# Patient Record
Sex: Female | Born: 1950
Health system: Southern US, Community
[De-identification: ages and names within clinical notes are randomized; demographics above are authoritative.]

## PROBLEM LIST (undated history)

## (undated) DIAGNOSIS — S82891A Other fracture of right lower leg, initial encounter for closed fracture: Secondary | ICD-10-CM

## (undated) DIAGNOSIS — M1990 Unspecified osteoarthritis, unspecified site: Secondary | ICD-10-CM

## (undated) DIAGNOSIS — R519 Headache, unspecified: Secondary | ICD-10-CM

## (undated) DIAGNOSIS — F329 Major depressive disorder, single episode, unspecified: Secondary | ICD-10-CM

## (undated) DIAGNOSIS — I2510 Atherosclerotic heart disease of native coronary artery without angina pectoris: Secondary | ICD-10-CM

## (undated) DIAGNOSIS — K219 Gastro-esophageal reflux disease without esophagitis: Secondary | ICD-10-CM

## (undated) DIAGNOSIS — I10 Essential (primary) hypertension: Secondary | ICD-10-CM

## (undated) DIAGNOSIS — I351 Nonrheumatic aortic (valve) insufficiency: Secondary | ICD-10-CM

## (undated) DIAGNOSIS — G8929 Other chronic pain: Secondary | ICD-10-CM

## (undated) DIAGNOSIS — M549 Dorsalgia, unspecified: Secondary | ICD-10-CM

## (undated) DIAGNOSIS — F32A Depression, unspecified: Secondary | ICD-10-CM

## (undated) DIAGNOSIS — K469 Unspecified abdominal hernia without obstruction or gangrene: Secondary | ICD-10-CM

## (undated) DIAGNOSIS — F419 Anxiety disorder, unspecified: Secondary | ICD-10-CM

## (undated) DIAGNOSIS — D649 Anemia, unspecified: Secondary | ICD-10-CM

## (undated) HISTORY — DX: Major depressive disorder, single episode, unspecified: F32.9

## (undated) HISTORY — DX: Anxiety disorder, unspecified: F41.9

## (undated) HISTORY — PX: COLONOSCOPY: SHX174

## (undated) HISTORY — DX: Depression, unspecified: F32.A

## (undated) HISTORY — DX: Dorsalgia, unspecified: M54.9

## (undated) HISTORY — PX: CATARACT EXTRACTION W/ INTRAOCULAR LENS IMPLANT: SHX1309

## (undated) HISTORY — PX: WISDOM TOOTH EXTRACTION: SHX21

## (undated) HISTORY — DX: Atherosclerotic heart disease of native coronary artery without angina pectoris: I25.10

## (undated) HISTORY — DX: Other chronic pain: G89.29

## (undated) HISTORY — DX: Nonrheumatic aortic (valve) insufficiency: I35.1

---

## 1997-10-26 ENCOUNTER — Other Ambulatory Visit: Admission: RE | Admit: 1997-10-26 | Discharge: 1997-10-26 | Payer: Self-pay | Admitting: Obstetrics & Gynecology

## 1998-11-09 ENCOUNTER — Other Ambulatory Visit: Admission: RE | Admit: 1998-11-09 | Discharge: 1998-11-09 | Payer: Self-pay | Admitting: Obstetrics & Gynecology

## 1999-12-18 ENCOUNTER — Other Ambulatory Visit: Admission: RE | Admit: 1999-12-18 | Discharge: 1999-12-18 | Payer: Self-pay | Admitting: Obstetrics & Gynecology

## 2000-06-23 ENCOUNTER — Emergency Department (HOSPITAL_COMMUNITY): Admission: EM | Admit: 2000-06-23 | Discharge: 2000-06-23 | Payer: Self-pay | Admitting: Emergency Medicine

## 2000-07-08 ENCOUNTER — Encounter: Payer: Self-pay | Admitting: Internal Medicine

## 2000-07-08 ENCOUNTER — Encounter: Admission: RE | Admit: 2000-07-08 | Discharge: 2000-07-08 | Payer: Self-pay | Admitting: Internal Medicine

## 2000-07-30 ENCOUNTER — Encounter: Admission: RE | Admit: 2000-07-30 | Discharge: 2000-09-02 | Payer: Self-pay | Admitting: Internal Medicine

## 2001-05-22 ENCOUNTER — Other Ambulatory Visit: Admission: RE | Admit: 2001-05-22 | Discharge: 2001-05-22 | Payer: Self-pay | Admitting: Obstetrics & Gynecology

## 2002-01-27 ENCOUNTER — Encounter: Payer: Self-pay | Admitting: Obstetrics & Gynecology

## 2002-01-27 ENCOUNTER — Encounter: Admission: RE | Admit: 2002-01-27 | Discharge: 2002-01-27 | Payer: Self-pay | Admitting: Obstetrics & Gynecology

## 2002-08-02 ENCOUNTER — Other Ambulatory Visit: Admission: RE | Admit: 2002-08-02 | Discharge: 2002-08-02 | Payer: Self-pay | Admitting: Obstetrics & Gynecology

## 2002-08-04 ENCOUNTER — Encounter: Admission: RE | Admit: 2002-08-04 | Discharge: 2002-08-04 | Payer: Self-pay | Admitting: Psychiatry

## 2002-08-10 ENCOUNTER — Encounter: Admission: RE | Admit: 2002-08-10 | Discharge: 2002-08-10 | Payer: Self-pay | Admitting: Psychiatry

## 2002-08-18 ENCOUNTER — Encounter: Admission: RE | Admit: 2002-08-18 | Discharge: 2002-08-18 | Payer: Self-pay | Admitting: Psychiatry

## 2002-08-24 ENCOUNTER — Encounter: Admission: RE | Admit: 2002-08-24 | Discharge: 2002-08-24 | Payer: Self-pay | Admitting: Psychiatry

## 2003-06-30 ENCOUNTER — Encounter: Admission: RE | Admit: 2003-06-30 | Discharge: 2003-06-30 | Payer: Self-pay | Admitting: Obstetrics & Gynecology

## 2003-09-09 ENCOUNTER — Other Ambulatory Visit: Admission: RE | Admit: 2003-09-09 | Discharge: 2003-09-09 | Payer: Self-pay | Admitting: Obstetrics & Gynecology

## 2003-11-15 ENCOUNTER — Ambulatory Visit (HOSPITAL_COMMUNITY): Admission: RE | Admit: 2003-11-15 | Discharge: 2003-11-15 | Payer: Self-pay | Admitting: Neurology

## 2004-10-29 ENCOUNTER — Other Ambulatory Visit: Admission: RE | Admit: 2004-10-29 | Discharge: 2004-10-29 | Payer: Self-pay | Admitting: Obstetrics & Gynecology

## 2004-12-04 ENCOUNTER — Ambulatory Visit (HOSPITAL_COMMUNITY): Admission: RE | Admit: 2004-12-04 | Discharge: 2004-12-04 | Payer: Self-pay | Admitting: Obstetrics & Gynecology

## 2005-12-05 ENCOUNTER — Ambulatory Visit (HOSPITAL_COMMUNITY): Admission: RE | Admit: 2005-12-05 | Discharge: 2005-12-05 | Payer: Self-pay | Admitting: Obstetrics & Gynecology

## 2008-02-03 ENCOUNTER — Encounter: Admission: RE | Admit: 2008-02-03 | Discharge: 2008-02-03 | Payer: Self-pay | Admitting: Obstetrics & Gynecology

## 2008-07-21 ENCOUNTER — Encounter (INDEPENDENT_AMBULATORY_CARE_PROVIDER_SITE_OTHER): Payer: Self-pay | Admitting: *Deleted

## 2009-02-06 ENCOUNTER — Telehealth: Payer: Self-pay | Admitting: Gastroenterology

## 2009-12-06 ENCOUNTER — Encounter (INDEPENDENT_AMBULATORY_CARE_PROVIDER_SITE_OTHER): Payer: Self-pay | Admitting: *Deleted

## 2010-02-20 NOTE — Letter (Signed)
Summary: Pre Visit Letter Revised  [facility]  [address] [place], [place] [address]   Phone: [phone]  Fax: [phone]        12/06/2009 MRN: [id number]   [name] [address] [place], [place]  [address]  [place]             Procedure Date:  01/10/10  Welcome to the Gastroenterology Division at [facility].    You are scheduled to see a nurse for your pre-procedure visit on 12/27/09 at 10:30 A.M.  on the 3rd floor at [facility], [address]  We ask that you try to arrive at our office 15 minutes prior to your appointment time to allow for check-in.  Please take a minute to review the attached form.  If you answer "Yes" to one or more of the questions on the first page, we ask that you call the person listed at your earliest opportunity.  If you answer "No" to all of the questions, please complete the rest of the form and bring it to your appointment.    Your nurse visit will consist of discussing your medical and surgical history, your immediate family medical history, and your medications.   If you are unable to list all of your medications on the form, please bring the medication bottles to your appointment and we will list them.  We will need to be aware of both prescribed and over the counter drugs.  We will need to know exact dosage information as well.    Please be prepared to read and sign documents such as consent forms, a financial agreement, and acknowledgement forms.  If necessary, and with your consent, a friend or relative is welcome to sit-in on the nurse visit with you.  Please bring your insurance card so that we may make a copy of it.  If your insurance requires a referral to see a specialist, please bring your referral form from your primary care physician.  No co-pay is required for this nurse visit.     If you cannot keep your appointment, please call [phone] to cancel or reschedule prior to your appointment date.  This  allows [place] the opportunity to schedule an appointment for another patient in need of care.    Thank you for choosing [facility] for your medical needs.  We appreciate the opportunity to care for you.  Please visit [place] at our website  to learn more about our practice.  Sincerely, The Gastroenterology Division

## 2010-02-20 NOTE — Progress Notes (Signed)
Summary: Schedule recall colon  Phone Note Outgoing Call Call back at [facility] Phone [phone]   Call placed by: Christie Nottingham CMA Duncan Dull),  February 06, 2009 11:03 AM Call placed to: Patient Summary of Call: Called pt to schedule recall colon and pt's number is busy. Initial call taken by: Christie Nottingham CMA Duncan Dull),  February 06, 2009 11:03 AM  Follow-up for Phone Call        patient states that she has alot of appt right now and she will call back later ro schedule  Follow-up by: Harlow Mares CMA Duncan Dull),  February 15, 2009 10:03 AM

## 2010-12-18 ENCOUNTER — Other Ambulatory Visit: Payer: Self-pay | Admitting: Obstetrics & Gynecology

## 2010-12-18 DIAGNOSIS — R922 Inconclusive mammogram: Secondary | ICD-10-CM

## 2010-12-19 ENCOUNTER — Encounter: Payer: Self-pay | Admitting: Gastroenterology

## 2010-12-28 ENCOUNTER — Ambulatory Visit
Admission: RE | Admit: 2010-12-28 | Discharge: 2010-12-28 | Disposition: A | Discharge status: Home / Self Care | Payer: Medicare Other | Source: Ambulatory Visit | Attending: Obstetrics & Gynecology | Admitting: Obstetrics & Gynecology

## 2010-12-28 ENCOUNTER — Other Ambulatory Visit: Payer: Self-pay | Admitting: Obstetrics & Gynecology

## 2010-12-28 DIAGNOSIS — R922 Inconclusive mammogram: Secondary | ICD-10-CM

## 2010-12-28 DIAGNOSIS — R9230 Dense breasts, unspecified: Secondary | ICD-10-CM

## 2011-01-17 ENCOUNTER — Ambulatory Visit (AMBULATORY_SURGERY_CENTER): Payer: Medicare Other | Admitting: *Deleted

## 2011-01-17 VITALS — Ht 64.5 in | Wt 137.4 lb

## 2011-01-17 DIAGNOSIS — Z1211 Encounter for screening for malignant neoplasm of colon: Secondary | ICD-10-CM

## 2011-01-17 MED ORDER — PEG-KCL-NACL-NASULF-NA ASC-C 100 G PO SOLR: ORAL | Status: DC

## 2011-01-18 ENCOUNTER — Encounter: Payer: Self-pay | Admitting: Gastroenterology

## 2011-01-23 ENCOUNTER — Telehealth: Payer: Self-pay | Admitting: Gastroenterology

## 2011-01-23 NOTE — Telephone Encounter (Signed)
Patient wanted to know if she could take Mag Citrate instead of the Movi prep for the Colonoscopy. Informed patient that the Mag Citrate will not completely clean out her Colon and she will have to take the Movi prep instead. Pt agreed and verbalized understanding.

## 2011-01-31 ENCOUNTER — Other Ambulatory Visit: Payer: Self-pay | Admitting: Gastroenterology

## 2011-02-13 ENCOUNTER — Ambulatory Visit (AMBULATORY_SURGERY_CENTER): Payer: Medicare Other | Admitting: Gastroenterology

## 2011-02-13 ENCOUNTER — Encounter: Payer: Self-pay | Admitting: Gastroenterology

## 2011-02-13 DIAGNOSIS — D126 Benign neoplasm of colon, unspecified: Secondary | ICD-10-CM

## 2011-02-13 DIAGNOSIS — Z86010 Personal history of colonic polyps: Secondary | ICD-10-CM

## 2011-02-13 DIAGNOSIS — Z1211 Encounter for screening for malignant neoplasm of colon: Secondary | ICD-10-CM

## 2011-02-13 DIAGNOSIS — K635 Polyp of colon: Secondary | ICD-10-CM

## 2011-02-13 MED ORDER — SODIUM CHLORIDE 0.9 % IV SOLN: 500.0000 mL | INTRAVENOUS | Status: DC

## 2011-02-13 NOTE — Progress Notes (Signed)
Patient did not experience any of the following events: a burn prior to discharge; a fall within the facility; wrong site/side/patient/procedure/implant event; or a hospital transfer or hospital admission upon discharge from the facility. (G8907) Patient did not have preoperative order for IV antibiotic SSI prophylaxis. (G8918)  

## 2011-02-13 NOTE — Op Note (Signed)
[facility] [address] [place], [place]  [address]  COLONOSCOPY PROCEDURE REPORT  PATIENT:  [name], [name]  MR#:  [id number] BIRTHDATE:  1950-07-23, 60 yrs. old  GENDER:  female ENDOSCOPIST:  Judie Petit T. Russella Dar, MD, [facility]  PROCEDURE DATE:  02/13/2011 PROCEDURE:  Colonoscopy with snare polypectomy ASA CLASS:  Class II INDICATIONS:  1) surveillance and high-risk screening  2) history of pre-cancerous (adenomatous) colon polyps: 08/2003. MEDICATIONS:   MAC sedation, administered by CRNA, propofol (Diprivan) 300 mg IV DESCRIPTION OF PROCEDURE:   After the risks benefits and alternatives of the procedure were thoroughly explained, informed consent was obtained.  Digital rectal exam was performed and revealed no abnormalities.   The LB [id number]-[id number] endoscope was introduced through the anus and advanced to the cecum, which was identified by both the appendix and ileocecal valve, without limitations.  The quality of the prep was excellent, using MoviPrep.  The instrument was then slowly withdrawn as the colon was fully examined. <<PROCEDUREIMAGES>> FINDINGS:  A sessile polyp was found in the cecum. It was 5 mm in size. Polyp was snared without cautery. Retrieval was successful. A sessile polyp was found in the distal transverse colon. It was 8 mm in size. Polyp was snared without cautery. Retrieval was successful.  Otherwise normal colonoscopy without other polyps, masses, vascular ectasias, or inflammatory changes.   Retroflexed views in the rectum revealed no abnormalities.  The time to cecum =  4.75  minutes. The scope was then withdrawn (time =  11.33 min) from the patient and the procedure completed.  COMPLICATIONS:  None  ENDOSCOPIC IMPRESSION: 1) 5 mm sessile polyp in the cecum 2) 8 mm sessile polyp in the distal transverse colon  RECOMMENDATIONS: 1) Await pathology results 2) Repeat Colonoscopy in 5 years.  Venita Lick. Russella Dar, MD, [facility]  CC:  Georgann Housekeeper  MD  n. Rosalie DoctorVenita Lick. Thirza Pellicano at 02/13/2011 10:23 AM  Derrill Kay, [phone]

## 2011-02-13 NOTE — Patient Instructions (Signed)
Please refer to your blue and neon green sheets for instructions regarding diet and activity for the rest of today.  You may resume your medications as you would normally take them.   Colon Polyps A polyp is extra tissue that grows inside your body. Colon polyps grow in the large intestine. The large intestine, also called the colon, is part of your digestive system. It is a long, hollow tube at the end of your digestive tract where your body makes and stores stool. Most polyps are not dangerous. They are benign. This means they are not cancerous. But over time, some types of polyps can turn into cancer. Polyps that are smaller than a pea are usually not harmful. But larger polyps could someday become or may already be cancerous. To be safe, doctors remove all polyps and test them.  WHO GETS POLYPS? Anyone can get polyps, but certain people are more likely than others. You may have a greater chance of getting polyps if:  You are over 50.   You have had polyps before.   Someone in your family has had polyps.   Someone in your family has had cancer of the large intestine.   Find out if someone in your family has had polyps. You may also be more likely to get polyps if you:   Eat a lot of fatty foods.   Smoke.   Drink alcohol.   Do not exercise.   Eat too much.  SYMPTOMS  Most small polyps do not cause symptoms. People often do not know they have one until their caregiver finds it during a regular checkup or while testing them for something else. Some people do have symptoms like these:  Bleeding from the anus. You might notice blood on your underwear or on toilet paper after you have had a bowel movement.   Constipation or diarrhea that lasts more than a week.   Blood in the stool. Blood can make stool look black or it can show up as red streaks in the stool.  If you have any of these symptoms, see your caregiver. HOW DOES THE DOCTOR TEST FOR POLYPS? The doctor can use four  tests to check for polyps:  Digital rectal exam. The caregiver wears gloves and checks your rectum (the last part of the large intestine) to see if it feels normal. This test would find polyps only in the rectum. Your caregiver may need to do one of the other tests listed below to find polyps higher up in the intestine.   Barium enema. The caregiver puts a liquid called barium into your rectum before taking x-rays of your large intestine. Barium makes your intestine look white in the pictures. Polyps are dark, so they are easy to see.   Sigmoidoscopy. With this test, the caregiver can see inside your large intestine. A thin flexible tube is placed into your rectum. The device is called a sigmoidoscope, which has a light and a tiny video camera in it. The caregiver uses the sigmoidoscope to look at the last third of your large intestine.   Colonoscopy. This test is like sigmoidoscopy, but the caregiver looks at all of the large intestine. It usually requires sedation. This is the most common method for finding and removing polyps.  TREATMENT   The caregiver will remove the polyp during sigmoidoscopy or colonoscopy. The polyp is then tested for cancer.   If you have had polyps, your caregiver may want you to get tested regularly in the future.    PREVENTION  There is not one sure way to prevent polyps. You might be able to lower your risk of getting them if you:  Eat more fruits and vegetables and less fatty food.   Do not smoke.   Avoid alcohol.   Exercise every day.   Lose weight if you are overweight.   Eating more calcium and folate can also lower your risk of getting polyps. Some foods that are rich in calcium are milk, cheese, and broccoli. Some foods that are rich in folate are chickpeas, kidney beans, and spinach.   Aspirin might help prevent polyps. Studies are under way.  Document Released: 10/04/2003 Document Revised: 09/19/2010 Document Reviewed: 03/11/2007 [employer] Patient  Information 2012 [employer]. 

## 2011-02-14 ENCOUNTER — Telehealth: Payer: Self-pay | Admitting: *Deleted

## 2011-02-14 NOTE — Telephone Encounter (Signed)

## 2011-02-18 ENCOUNTER — Encounter: Payer: Self-pay | Admitting: Gastroenterology

## 2011-07-24 ENCOUNTER — Other Ambulatory Visit: Payer: Self-pay | Admitting: Neurology

## 2011-07-24 DIAGNOSIS — Z79899 Other long term (current) drug therapy: Secondary | ICD-10-CM

## 2011-07-24 DIAGNOSIS — R51 Headache: Secondary | ICD-10-CM

## 2011-07-24 DIAGNOSIS — G43119 Migraine with aura, intractable, without status migrainosus: Secondary | ICD-10-CM

## 2011-08-01 ENCOUNTER — Ambulatory Visit
Admission: RE | Admit: 2011-08-01 | Discharge: 2011-08-01 | Disposition: A | Discharge status: Home / Self Care | Payer: Medicare Other | Source: Ambulatory Visit | Attending: Neurology | Admitting: Neurology

## 2011-08-01 DIAGNOSIS — Z79899 Other long term (current) drug therapy: Secondary | ICD-10-CM

## 2011-08-01 DIAGNOSIS — R51 Headache: Secondary | ICD-10-CM

## 2011-08-01 DIAGNOSIS — G43119 Migraine with aura, intractable, without status migrainosus: Secondary | ICD-10-CM

## 2011-08-01 MED ORDER — GADOBENATE DIMEGLUMINE 529 MG/ML IV SOLN
6.0000 mL | Freq: Once | INTRAVENOUS | Status: AC | PRN
  Administered 2011-08-01: 6 mL via INTRAVENOUS

## 2012-08-13 ENCOUNTER — Telehealth: Payer: Self-pay | Admitting: Neurology

## 2012-08-17 ENCOUNTER — Encounter: Payer: Self-pay | Admitting: Neurology

## 2012-08-21 ENCOUNTER — Ambulatory Visit (INDEPENDENT_AMBULATORY_CARE_PROVIDER_SITE_OTHER): Payer: Medicare Other | Admitting: Neurology

## 2012-08-21 ENCOUNTER — Telehealth: Payer: Self-pay | Admitting: Neurology

## 2012-08-21 ENCOUNTER — Encounter: Payer: Self-pay | Admitting: Neurology

## 2012-08-21 VITALS — BP 131/78 | HR 70 | Temp 98.3°F | Ht 65.5 in | Wt 142.0 lb

## 2012-08-21 DIAGNOSIS — G43009 Migraine without aura, not intractable, without status migrainosus: Secondary | ICD-10-CM

## 2012-08-21 MED ORDER — RIZATRIPTAN BENZOATE 10 MG PO TABS: 10.0000 mg | ORAL_TABLET | Freq: Every day | ORAL | Status: AC | PRN

## 2012-08-21 MED ORDER — TOPIRAMATE 100 MG PO TABS: 100.0000 mg | ORAL_TABLET | Freq: Every day | ORAL | Status: AC
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
## 2012-08-21 NOTE — Progress Notes (Deleted)
Subjective:    Patient ID: [name] is a 62 y.o. female.  HPI {Common ambulatory SmartLinks:19316}  Review of Systems  All other systems reviewed and are negative.    Objective:  Neurologic Exam  Physical Exam  Assessment:   ***  Plan:   ***

## 2012-08-21 NOTE — Patient Instructions (Addendum)
Please remember, common headache triggers are: sleep deprivation, dehydration, overheating, stress, hypoglycemia or skipping meals, excessive pain medications or excessive alcohol use. Some people have food triggers such as aged cheese, orange juice or chocolate, especially dark chocolate. Try to avoid these headache triggers as much possible. It may be helpful to keep a headache diary to figure out what makes your headaches worse or brings them on. Some people report headache onset after exercise but studies have shown that regular exercise may actually prevent headaches from coming on. If you have exercise-induced headaches, please make sure that you drink plenty of fluid before and after exercising and that you don't over do it. I think overall you are doing fairly well but I do want to suggest a few things today:  Remember to drink plenty of fluid, eat healthy meals and do not skip any meals. Try to eat protein with a every meal and eat a healthy snack such as fruit or nuts in between meals. Try to keep a regular sleep-wake schedule and try to exercise daily, particularly in the form of walking, 20-30 minutes a day, if you can.   As far as your medications are concerned, I would like to suggest no changes.   Please be aware that Maxalt and Prozac together can sometimes cause a rare and potentially dangerous side effect, called serotonin syndrome: symptoms include (but are not limited to):  Agitation or restlessness, Confusion, Rapid heart rate and high blood pressure, Dilated pupils, Loss of muscle coordination or twitching muscles, Muscle rigidity/stiffness, sweating and/or flushing, Diarrhea, headache, shivering, goose bumps. Severe serotonin syndrome can be life-threatening. Signs and symptoms include: High fever, Seizures, Irregular heartbeat, Unconsciousness.  If you have any of these new symptoms, call 911 or have someone take you to the emergency room.    As far as diagnostic testing: no new  test needed today.  I suggest that you see one of my associates in about 6 months for routine follow up. I will have our nurse, Alverda Skeans, call you for your appointment. Please call in the interim, if you have any questions or concerns.   Our phone number is [phone]. We also have an after hours call service for urgent matters and there is a physician on-call for urgent questions. For any emergencies you know to call 911 or go to the nearest emergency room.

## 2012-08-24 NOTE — Telephone Encounter (Signed)
Patient had  Questions about her BMI

## 2012-09-10 ENCOUNTER — Encounter: Payer: Self-pay | Admitting: Neurology

## 2012-12-28 ENCOUNTER — Other Ambulatory Visit: Payer: Self-pay | Admitting: Internal Medicine

## 2012-12-28 DIAGNOSIS — K219 Gastro-esophageal reflux disease without esophagitis: Secondary | ICD-10-CM

## 2012-12-28 DIAGNOSIS — R1310 Dysphagia, unspecified: Secondary | ICD-10-CM

## 2012-12-29 ENCOUNTER — Ambulatory Visit
Admission: RE | Admit: 2012-12-29 | Discharge: 2012-12-29 | Disposition: A | Discharge status: Home / Self Care | Payer: Medicare Other | Source: Ambulatory Visit | Attending: Internal Medicine | Admitting: Internal Medicine

## 2012-12-29 DIAGNOSIS — R1310 Dysphagia, unspecified: Secondary | ICD-10-CM

## 2012-12-29 DIAGNOSIS — K219 Gastro-esophageal reflux disease without esophagitis: Secondary | ICD-10-CM

## 2013-04-01 NOTE — Telephone Encounter (Signed)
Pt came in for her visit, closing encounter °

## 2014-01-25 ENCOUNTER — Other Ambulatory Visit: Payer: Self-pay | Admitting: Obstetrics & Gynecology

## 2014-01-27 LAB — CYTOLOGY - PAP

## 2014-06-30 ENCOUNTER — Encounter: Payer: Self-pay | Admitting: Gastroenterology

## 2015-02-01 DIAGNOSIS — Z6824 Body mass index (BMI) 24.0-24.9, adult: Secondary | ICD-10-CM | POA: Diagnosis not present

## 2015-02-01 DIAGNOSIS — Z1231 Encounter for screening mammogram for malignant neoplasm of breast: Secondary | ICD-10-CM | POA: Diagnosis not present

## 2015-02-01 DIAGNOSIS — N952 Postmenopausal atrophic vaginitis: Secondary | ICD-10-CM | POA: Diagnosis not present

## 2015-02-01 DIAGNOSIS — M8588 Other specified disorders of bone density and structure, other site: Secondary | ICD-10-CM | POA: Diagnosis not present

## 2015-02-01 DIAGNOSIS — N958 Other specified menopausal and perimenopausal disorders: Secondary | ICD-10-CM | POA: Diagnosis not present

## 2015-02-01 DIAGNOSIS — Z01419 Encounter for gynecological examination (general) (routine) without abnormal findings: Secondary | ICD-10-CM | POA: Diagnosis not present

## 2015-02-09 DIAGNOSIS — R531 Weakness: Secondary | ICD-10-CM | POA: Diagnosis not present

## 2015-02-09 DIAGNOSIS — Z0000 Encounter for general adult medical examination without abnormal findings: Secondary | ICD-10-CM | POA: Diagnosis not present

## 2015-02-09 DIAGNOSIS — F325 Major depressive disorder, single episode, in full remission: Secondary | ICD-10-CM | POA: Diagnosis not present

## 2015-02-09 DIAGNOSIS — Z1389 Encounter for screening for other disorder: Secondary | ICD-10-CM | POA: Diagnosis not present

## 2015-02-09 DIAGNOSIS — M5090 Cervical disc disorder, unspecified, unspecified cervical region: Secondary | ICD-10-CM | POA: Diagnosis not present

## 2015-02-09 DIAGNOSIS — N183 Chronic kidney disease, stage 3 (moderate): Secondary | ICD-10-CM | POA: Diagnosis not present

## 2015-02-09 DIAGNOSIS — E782 Mixed hyperlipidemia: Secondary | ICD-10-CM | POA: Diagnosis not present

## 2015-02-09 DIAGNOSIS — K219 Gastro-esophageal reflux disease without esophagitis: Secondary | ICD-10-CM | POA: Diagnosis not present

## 2015-02-09 DIAGNOSIS — R51 Headache: Secondary | ICD-10-CM | POA: Diagnosis not present

## 2015-02-21 DIAGNOSIS — F332 Major depressive disorder, recurrent severe without psychotic features: Secondary | ICD-10-CM | POA: Diagnosis not present

## 2015-04-27 DIAGNOSIS — F332 Major depressive disorder, recurrent severe without psychotic features: Secondary | ICD-10-CM | POA: Diagnosis not present

## 2015-08-09 DIAGNOSIS — F332 Major depressive disorder, recurrent severe without psychotic features: Secondary | ICD-10-CM | POA: Diagnosis not present

## 2015-11-20 ENCOUNTER — Encounter: Payer: Self-pay | Admitting: Gastroenterology

## 2015-11-23 DIAGNOSIS — F332 Major depressive disorder, recurrent severe without psychotic features: Secondary | ICD-10-CM | POA: Diagnosis not present

## 2015-12-01 DIAGNOSIS — L2089 Other atopic dermatitis: Secondary | ICD-10-CM | POA: Diagnosis not present

## 2015-12-04 ENCOUNTER — Encounter: Payer: Self-pay | Admitting: Gastroenterology

## 2015-12-20 ENCOUNTER — Encounter: Payer: Self-pay | Admitting: Gastroenterology

## 2016-02-14 DIAGNOSIS — F332 Major depressive disorder, recurrent severe without psychotic features: Secondary | ICD-10-CM | POA: Diagnosis not present

## 2016-02-19 ENCOUNTER — Ambulatory Visit (AMBULATORY_SURGERY_CENTER): Payer: Self-pay | Admitting: *Deleted

## 2016-02-19 ENCOUNTER — Encounter: Payer: Self-pay | Admitting: Gastroenterology

## 2016-02-19 VITALS — Ht 65.0 in | Wt 148.0 lb

## 2016-02-19 DIAGNOSIS — Z86010 Personal history of colonic polyps: Secondary | ICD-10-CM

## 2016-02-19 MED ORDER — NA SULFATE-K SULFATE-MG SULF 17.5-3.13-1.6 GM/177ML PO SOLN: ORAL | 0 refills | Status: DC

## 2016-02-19 NOTE — Progress Notes (Signed)
Patient denies any allergies to eggs or soy. Patient denies any problems with anesthesia/sedation. Patient denies any oxygen use at home and does not take any diet/weight loss medications. Unable to give out EMMI education, no email/computer use per pt.

## 2016-02-27 DIAGNOSIS — Z124 Encounter for screening for malignant neoplasm of cervix: Secondary | ICD-10-CM | POA: Diagnosis not present

## 2016-02-27 DIAGNOSIS — Z1231 Encounter for screening mammogram for malignant neoplasm of breast: Secondary | ICD-10-CM | POA: Diagnosis not present

## 2016-02-27 DIAGNOSIS — Z01419 Encounter for gynecological examination (general) (routine) without abnormal findings: Secondary | ICD-10-CM | POA: Diagnosis not present

## 2016-02-27 DIAGNOSIS — Z6824 Body mass index (BMI) 24.0-24.9, adult: Secondary | ICD-10-CM | POA: Diagnosis not present

## 2016-02-27 DIAGNOSIS — B373 Candidiasis of vulva and vagina: Secondary | ICD-10-CM | POA: Diagnosis not present

## 2016-03-04 ENCOUNTER — Encounter: Payer: Self-pay | Admitting: Gastroenterology

## 2016-03-18 DIAGNOSIS — E782 Mixed hyperlipidemia: Secondary | ICD-10-CM | POA: Diagnosis not present

## 2016-03-18 DIAGNOSIS — Z1159 Encounter for screening for other viral diseases: Secondary | ICD-10-CM | POA: Diagnosis not present

## 2016-03-18 DIAGNOSIS — F419 Anxiety disorder, unspecified: Secondary | ICD-10-CM | POA: Diagnosis not present

## 2016-03-18 DIAGNOSIS — R51 Headache: Secondary | ICD-10-CM | POA: Diagnosis not present

## 2016-03-18 DIAGNOSIS — I10 Essential (primary) hypertension: Secondary | ICD-10-CM | POA: Diagnosis not present

## 2016-03-18 DIAGNOSIS — N183 Chronic kidney disease, stage 3 (moderate): Secondary | ICD-10-CM | POA: Diagnosis not present

## 2016-03-18 DIAGNOSIS — K219 Gastro-esophageal reflux disease without esophagitis: Secondary | ICD-10-CM | POA: Diagnosis not present

## 2016-03-18 DIAGNOSIS — R030 Elevated blood-pressure reading, without diagnosis of hypertension: Secondary | ICD-10-CM | POA: Diagnosis not present

## 2016-03-18 DIAGNOSIS — F325 Major depressive disorder, single episode, in full remission: Secondary | ICD-10-CM | POA: Diagnosis not present

## 2016-03-18 DIAGNOSIS — Z1389 Encounter for screening for other disorder: Secondary | ICD-10-CM | POA: Diagnosis not present

## 2016-03-18 DIAGNOSIS — Z0000 Encounter for general adult medical examination without abnormal findings: Secondary | ICD-10-CM | POA: Diagnosis not present

## 2016-04-05 ENCOUNTER — Ambulatory Visit (AMBULATORY_SURGERY_CENTER): Payer: PPO | Admitting: Gastroenterology

## 2016-04-05 ENCOUNTER — Encounter: Payer: Self-pay | Admitting: Gastroenterology

## 2016-04-05 VITALS — BP 114/62 | HR 88 | Temp 98.2°F | Resp 22 | Ht 65.0 in | Wt 148.0 lb

## 2016-04-05 DIAGNOSIS — Z86010 Personal history of colonic polyps: Secondary | ICD-10-CM

## 2016-04-05 DIAGNOSIS — D123 Benign neoplasm of transverse colon: Secondary | ICD-10-CM

## 2016-04-05 DIAGNOSIS — F329 Major depressive disorder, single episode, unspecified: Secondary | ICD-10-CM | POA: Diagnosis not present

## 2016-04-05 MED ORDER — SODIUM CHLORIDE 0.9 % IV SOLN: 500.0000 mL | INTRAVENOUS | Status: DC

## 2016-04-05 NOTE — Patient Instructions (Signed)
YOU HAD AN ENDOSCOPIC PROCEDURE TODAY AT THE [facility]:   Refer to the procedure report that was given to you for any specific questions about what was found during the examination.  If the procedure report does not answer your questions, please call your gastroenterologist to clarify.  If you requested that your care partner not be given the details of your procedure findings, then the procedure report has been included in a sealed envelope for you to review at your convenience later.  YOU SHOULD EXPECT: Some feelings of bloating in the abdomen. Passage of more gas than usual.  Walking can help get rid of the air that was put into your GI tract during the procedure and reduce the bloating. If you had a lower endoscopy (such as a colonoscopy or flexible sigmoidoscopy) you may notice spotting of blood in your stool or on the toilet paper. If you underwent a bowel prep for your procedure, you may not have a normal bowel movement for a few days.  Please Note:  You might notice some irritation and congestion in your nose or some drainage.  This is from the oxygen used during your procedure.  There is no need for concern and it should clear up in a day or so.  SYMPTOMS TO REPORT IMMEDIATELY:   Following lower endoscopy (colonoscopy or flexible sigmoidoscopy):  Excessive amounts of blood in the stool  Significant tenderness or worsening of abdominal pains  Swelling of the abdomen that is new, acute  Fever of 100F or higher   For urgent or emergent issues, a gastroenterologist can be reached at any hour by calling [phone].   DIET:  We do recommend a small meal at first, but then you may proceed to your regular diet.  Drink plenty of fluids but you should avoid alcoholic beverages for 24 hours.  ACTIVITY:  You should plan to take it easy for the rest of today and you should NOT DRIVE or use heavy machinery until tomorrow (because of the sedation medicines used during the test).     FOLLOW UP: Our staff will call the number listed on your records the next business day following your procedure to check on you and address any questions or concerns that you may have regarding the information given to you following your procedure. If we do not reach you, we will leave a message.  However, if you are feeling well and you are not experiencing any problems, there is no need to return our call.  We will assume that you have returned to your regular daily activities without incident.  If any biopsies were taken you will be contacted by phone or by letter within the next 1-3 weeks.  Please call us at [phone] if you have not heard about the biopsies in 3 weeks.    SIGNATURES/CONFIDENTIALITY: You and/or your care partner have signed paperwork which will be entered into your electronic medical record.  These signatures attest to the fact that that the information above on your After Visit Summary has been reviewed and is understood.  Full responsibility of the confidentiality of this discharge information lies with you and/or your care-partner.  Read all of the handouts given to you by your recovery room nurse. 

## 2016-04-05 NOTE — Progress Notes (Signed)
Report to PACU, RN, vss, BBS= Clear.  

## 2016-04-05 NOTE — Op Note (Signed)
[facility] Patient Name: [name] Procedure Date: 04/05/2016 10:08 AM MRN: [id number] Endoscopist: Ladene Artist , MD Age: 66 Referring MD:  Date of Birth: 1950/03/16 Gender: Female Account #: [id number] Procedure:                Colonoscopy Indications:              Surveillance: Personal history of adenomatous                            polyps on last colonoscopy 5 years ago Medicines:                Monitored Anesthesia Care Procedure:                Pre-Anesthesia Assessment:                           - Prior to the procedure, a History and Physical                            was performed, and patient medications and                            allergies were reviewed. The patient's tolerance of                            previous anesthesia was also reviewed. The risks                            and benefits of the procedure and the sedation                            options and risks were discussed with the patient.                            All questions were answered, and informed consent                            was obtained. Prior Anticoagulants: The patient has                            taken no previous anticoagulant or antiplatelet                            agents. ASA Grade Assessment: II - A patient with                            mild systemic disease. After reviewing the risks                            and benefits, the patient was deemed in                            satisfactory condition to undergo the procedure.  After obtaining informed consent, the colonoscope                            was passed under direct vision. Throughout the                            procedure, the patient's blood pressure, pulse, and                            oxygen saturations were monitored continuously. The                            Model PCF-[id number]) scope was introduced                            through the anus and  advanced to the the cecum,                            identified by appendiceal orifice and ileocecal                            valve. The ileocecal valve, appendiceal orifice,                            and rectum were photographed. The quality of the                            bowel preparation was excellent. The patient                            tolerated the procedure well. The colonoscopy was                            somewhat difficult due to a tortuous colon.                            Successful completion of the procedure was aided by                            using manual pressure, withdrawing and reinserting                            the scope and straightening and shortening the                            scope to obtain bowel loop reduction. Scope In: 10:21:57 AM Scope Out: 10:38:04 AM Scope Withdrawal Time: 0 hours 10 minutes 43 seconds  Total Procedure Duration: 0 hours 16 minutes 7 seconds  Findings:                 The perianal and digital rectal examinations were                            normal.  A 5 mm polyp was found in the transverse colon. The                            polyp was sessile. The polyp was removed with a                            cold biopsy forceps. Resection and retrieval were                            complete.                           Internal hemorrhoids were found during                            retroflexion. The hemorrhoids were small and Grade                            I (internal hemorrhoids that do not prolapse).                           The exam was otherwise without abnormality on                            direct and retroflexion views. Complications:            No immediate complications. Estimated blood loss:                            None. Estimated Blood Loss:     Estimated blood loss: none. Impression:               - One 5 mm polyp in the transverse colon, removed                             with a cold biopsy forceps. Resected and retrieved.                           - Internal hemorrhoids.                           - The examination was otherwise normal on direct                            and retroflexion views. Recommendation:           - Repeat colonoscopy in 5 years for surveillance.                           - Patient has a contact number available for                            emergencies. The signs and symptoms of potential                            delayed complications were  discussed with the                            patient. Return to normal activities tomorrow.                            Written discharge instructions were provided to the                            patient.                           - Resume previous diet.                           - Continue present medications.                           - Await pathology results. Ladene Artist, MD 04/05/2016 10:41:15 AM This report has been signed electronically.

## 2016-04-05 NOTE — Progress Notes (Signed)
Pt's states no medical or surgical changes since previsit or office visit. maw 

## 2016-04-05 NOTE — Progress Notes (Signed)
Called to room to assist during endoscopic procedure.  Patient ID and intended procedure confirmed with present staff. Received instructions for my participation in the procedure from the performing physician.  

## 2016-04-08 ENCOUNTER — Telehealth: Payer: Self-pay | Admitting: *Deleted

## 2016-04-08 NOTE — Telephone Encounter (Signed)
  Follow up Call-  Call back number 04/05/2016  Post procedure Call Back phone  # [phone] hm  Permission to leave phone message Yes  Some recent data might be hidden     Patient questions:  Do you have a fever, pain , or abdominal swelling? No. Pain Score  0 *  Have you tolerated food without any problems? Yes.    Have you been able to return to your normal activities? Yes.    Do you have any questions about your discharge instructions: Diet   No. Medications  No. Follow up visit  No.  Do you have questions or concerns about your Care? No.  Actions: * If pain score is 4 or above: No action needed, pain <4.

## 2016-04-19 ENCOUNTER — Encounter: Payer: Self-pay | Admitting: Gastroenterology

## 2016-05-08 DIAGNOSIS — F332 Major depressive disorder, recurrent severe without psychotic features: Secondary | ICD-10-CM | POA: Diagnosis not present

## 2016-07-15 ENCOUNTER — Other Ambulatory Visit: Payer: Self-pay | Admitting: Internal Medicine

## 2016-07-15 ENCOUNTER — Telehealth: Payer: Self-pay | Admitting: Gastroenterology

## 2016-07-15 DIAGNOSIS — R1310 Dysphagia, unspecified: Secondary | ICD-10-CM | POA: Diagnosis not present

## 2016-07-15 DIAGNOSIS — K219 Gastro-esophageal reflux disease without esophagitis: Secondary | ICD-10-CM | POA: Diagnosis not present

## 2016-07-15 NOTE — Telephone Encounter (Signed)
Patient reports that since Saturday she has had "food lodged in her throat".  She reports that when she eats or drinks anything "it won't go down and I have to throw it back up".  Reports that had some relief on Saturday night, but after eating yesterday it all started again.  Patient advised that she will need to be evaluated in the ED.  Hx with Dr. Fuller Plan for colonoscopy only. Patient verbalized understanding.

## 2016-07-16 ENCOUNTER — Ambulatory Visit
Admission: RE | Admit: 2016-07-16 | Discharge: 2016-07-16 | Disposition: A | Discharge status: Home / Self Care | Payer: PPO | Source: Ambulatory Visit | Attending: Internal Medicine | Admitting: Internal Medicine

## 2016-07-16 DIAGNOSIS — R1310 Dysphagia, unspecified: Secondary | ICD-10-CM

## 2016-07-16 DIAGNOSIS — F332 Major depressive disorder, recurrent severe without psychotic features: Secondary | ICD-10-CM | POA: Diagnosis not present

## 2016-07-16 NOTE — Telephone Encounter (Signed)
Patient with large paraesophgeal hernia.  I left a detailed message for [name] at [facility] to have the patient come in and see [name] RNP on 07/25/16 to arrive at 8:15 for an 8:30 appt. [name] is asked to notify the patient of the appt date and times

## 2016-07-16 NOTE — Telephone Encounter (Signed)
[name] from [facility] states that patient did not go to ED yesterday as advised. Patient saw PCP and had barium swallow. Report in EPIC. Please advise. Dr. Fara Olden is requesting that patient follow up with Dr. Fuller Plan.

## 2016-07-25 ENCOUNTER — Ambulatory Visit: Payer: PPO | Admitting: Nurse Practitioner

## 2016-10-14 DIAGNOSIS — F332 Major depressive disorder, recurrent severe without psychotic features: Secondary | ICD-10-CM | POA: Diagnosis not present

## 2017-01-08 DIAGNOSIS — F332 Major depressive disorder, recurrent severe without psychotic features: Secondary | ICD-10-CM | POA: Diagnosis not present

## 2017-03-20 DIAGNOSIS — F332 Major depressive disorder, recurrent severe without psychotic features: Secondary | ICD-10-CM | POA: Diagnosis not present

## 2017-04-09 DIAGNOSIS — Z6823 Body mass index (BMI) 23.0-23.9, adult: Secondary | ICD-10-CM | POA: Diagnosis not present

## 2017-04-09 DIAGNOSIS — N390 Urinary tract infection, site not specified: Secondary | ICD-10-CM | POA: Diagnosis not present

## 2017-04-09 DIAGNOSIS — Z1231 Encounter for screening mammogram for malignant neoplasm of breast: Secondary | ICD-10-CM | POA: Diagnosis not present

## 2017-04-09 DIAGNOSIS — Z01419 Encounter for gynecological examination (general) (routine) without abnormal findings: Secondary | ICD-10-CM | POA: Diagnosis not present

## 2017-04-22 DIAGNOSIS — H2513 Age-related nuclear cataract, bilateral: Secondary | ICD-10-CM | POA: Diagnosis not present

## 2017-04-22 DIAGNOSIS — H5201 Hypermetropia, right eye: Secondary | ICD-10-CM | POA: Diagnosis not present

## 2017-04-22 DIAGNOSIS — H5212 Myopia, left eye: Secondary | ICD-10-CM | POA: Diagnosis not present

## 2017-05-07 DIAGNOSIS — H2511 Age-related nuclear cataract, right eye: Secondary | ICD-10-CM | POA: Diagnosis not present

## 2017-05-07 DIAGNOSIS — H2512 Age-related nuclear cataract, left eye: Secondary | ICD-10-CM | POA: Diagnosis not present

## 2017-05-14 DIAGNOSIS — H2512 Age-related nuclear cataract, left eye: Secondary | ICD-10-CM | POA: Diagnosis not present

## 2017-05-14 DIAGNOSIS — H2511 Age-related nuclear cataract, right eye: Secondary | ICD-10-CM | POA: Diagnosis not present

## 2017-06-04 DIAGNOSIS — K219 Gastro-esophageal reflux disease without esophagitis: Secondary | ICD-10-CM | POA: Diagnosis not present

## 2017-06-04 DIAGNOSIS — F325 Major depressive disorder, single episode, in full remission: Secondary | ICD-10-CM | POA: Diagnosis not present

## 2017-06-04 DIAGNOSIS — Z7189 Other specified counseling: Secondary | ICD-10-CM | POA: Diagnosis not present

## 2017-06-04 DIAGNOSIS — I10 Essential (primary) hypertension: Secondary | ICD-10-CM | POA: Diagnosis not present

## 2017-06-04 DIAGNOSIS — R51 Headache: Secondary | ICD-10-CM | POA: Diagnosis not present

## 2017-06-04 DIAGNOSIS — E782 Mixed hyperlipidemia: Secondary | ICD-10-CM | POA: Diagnosis not present

## 2017-06-04 DIAGNOSIS — N183 Chronic kidney disease, stage 3 (moderate): Secondary | ICD-10-CM | POA: Diagnosis not present

## 2017-06-04 DIAGNOSIS — Z0000 Encounter for general adult medical examination without abnormal findings: Secondary | ICD-10-CM | POA: Diagnosis not present

## 2017-06-04 DIAGNOSIS — Z1389 Encounter for screening for other disorder: Secondary | ICD-10-CM | POA: Diagnosis not present

## 2017-06-09 DIAGNOSIS — F332 Major depressive disorder, recurrent severe without psychotic features: Secondary | ICD-10-CM | POA: Diagnosis not present

## 2017-07-25 DIAGNOSIS — R600 Localized edema: Secondary | ICD-10-CM | POA: Diagnosis not present

## 2017-08-11 DIAGNOSIS — R609 Edema, unspecified: Secondary | ICD-10-CM | POA: Diagnosis not present

## 2017-08-11 DIAGNOSIS — I10 Essential (primary) hypertension: Secondary | ICD-10-CM | POA: Diagnosis not present

## 2017-09-10 DIAGNOSIS — F332 Major depressive disorder, recurrent severe without psychotic features: Secondary | ICD-10-CM | POA: Diagnosis not present

## 2017-09-17 DIAGNOSIS — I10 Essential (primary) hypertension: Secondary | ICD-10-CM | POA: Diagnosis not present

## 2017-09-17 DIAGNOSIS — F419 Anxiety disorder, unspecified: Secondary | ICD-10-CM | POA: Diagnosis not present

## 2017-10-16 DIAGNOSIS — N183 Chronic kidney disease, stage 3 (moderate): Secondary | ICD-10-CM | POA: Diagnosis not present

## 2017-10-16 DIAGNOSIS — I10 Essential (primary) hypertension: Secondary | ICD-10-CM | POA: Diagnosis not present

## 2017-10-16 DIAGNOSIS — E782 Mixed hyperlipidemia: Secondary | ICD-10-CM | POA: Diagnosis not present

## 2017-10-16 DIAGNOSIS — F325 Major depressive disorder, single episode, in full remission: Secondary | ICD-10-CM | POA: Diagnosis not present

## 2017-10-24 DIAGNOSIS — Z961 Presence of intraocular lens: Secondary | ICD-10-CM | POA: Diagnosis not present

## 2017-11-11 DIAGNOSIS — F325 Major depressive disorder, single episode, in full remission: Secondary | ICD-10-CM | POA: Diagnosis not present

## 2017-11-11 DIAGNOSIS — N183 Chronic kidney disease, stage 3 (moderate): Secondary | ICD-10-CM | POA: Diagnosis not present

## 2017-11-11 DIAGNOSIS — E782 Mixed hyperlipidemia: Secondary | ICD-10-CM | POA: Diagnosis not present

## 2017-11-11 DIAGNOSIS — I10 Essential (primary) hypertension: Secondary | ICD-10-CM | POA: Diagnosis not present

## 2017-11-26 DIAGNOSIS — F332 Major depressive disorder, recurrent severe without psychotic features: Secondary | ICD-10-CM | POA: Diagnosis not present

## 2017-12-16 DIAGNOSIS — E782 Mixed hyperlipidemia: Secondary | ICD-10-CM | POA: Diagnosis not present

## 2017-12-16 DIAGNOSIS — I10 Essential (primary) hypertension: Secondary | ICD-10-CM | POA: Diagnosis not present

## 2017-12-16 DIAGNOSIS — N183 Chronic kidney disease, stage 3 (moderate): Secondary | ICD-10-CM | POA: Diagnosis not present

## 2017-12-16 DIAGNOSIS — F325 Major depressive disorder, single episode, in full remission: Secondary | ICD-10-CM | POA: Diagnosis not present

## 2018-01-20 DIAGNOSIS — H43811 Vitreous degeneration, right eye: Secondary | ICD-10-CM | POA: Diagnosis not present

## 2018-02-11 DIAGNOSIS — F332 Major depressive disorder, recurrent severe without psychotic features: Secondary | ICD-10-CM | POA: Diagnosis not present

## 2018-02-18 DIAGNOSIS — I10 Essential (primary) hypertension: Secondary | ICD-10-CM | POA: Diagnosis not present

## 2018-02-18 DIAGNOSIS — N183 Chronic kidney disease, stage 3 (moderate): Secondary | ICD-10-CM | POA: Diagnosis not present

## 2018-02-18 DIAGNOSIS — E782 Mixed hyperlipidemia: Secondary | ICD-10-CM | POA: Diagnosis not present

## 2018-02-18 DIAGNOSIS — F325 Major depressive disorder, single episode, in full remission: Secondary | ICD-10-CM | POA: Diagnosis not present

## 2018-03-17 DIAGNOSIS — E782 Mixed hyperlipidemia: Secondary | ICD-10-CM | POA: Diagnosis not present

## 2018-03-17 DIAGNOSIS — N183 Chronic kidney disease, stage 3 (moderate): Secondary | ICD-10-CM | POA: Diagnosis not present

## 2018-03-17 DIAGNOSIS — I10 Essential (primary) hypertension: Secondary | ICD-10-CM | POA: Diagnosis not present

## 2018-03-17 DIAGNOSIS — F325 Major depressive disorder, single episode, in full remission: Secondary | ICD-10-CM | POA: Diagnosis not present

## 2018-04-15 DIAGNOSIS — Z124 Encounter for screening for malignant neoplasm of cervix: Secondary | ICD-10-CM | POA: Diagnosis not present

## 2018-04-15 DIAGNOSIS — Z1231 Encounter for screening mammogram for malignant neoplasm of breast: Secondary | ICD-10-CM | POA: Diagnosis not present

## 2018-04-15 DIAGNOSIS — Z6823 Body mass index (BMI) 23.0-23.9, adult: Secondary | ICD-10-CM | POA: Diagnosis not present

## 2018-04-17 DIAGNOSIS — I10 Essential (primary) hypertension: Secondary | ICD-10-CM | POA: Diagnosis not present

## 2018-04-17 DIAGNOSIS — N183 Chronic kidney disease, stage 3 (moderate): Secondary | ICD-10-CM | POA: Diagnosis not present

## 2018-04-17 DIAGNOSIS — F325 Major depressive disorder, single episode, in full remission: Secondary | ICD-10-CM | POA: Diagnosis not present

## 2018-04-17 DIAGNOSIS — E782 Mixed hyperlipidemia: Secondary | ICD-10-CM | POA: Diagnosis not present

## 2018-05-01 DIAGNOSIS — F332 Major depressive disorder, recurrent severe without psychotic features: Secondary | ICD-10-CM | POA: Diagnosis not present

## 2018-05-15 DIAGNOSIS — I10 Essential (primary) hypertension: Secondary | ICD-10-CM | POA: Diagnosis not present

## 2018-05-15 DIAGNOSIS — E782 Mixed hyperlipidemia: Secondary | ICD-10-CM | POA: Diagnosis not present

## 2018-05-15 DIAGNOSIS — F325 Major depressive disorder, single episode, in full remission: Secondary | ICD-10-CM | POA: Diagnosis not present

## 2018-05-15 DIAGNOSIS — N183 Chronic kidney disease, stage 3 (moderate): Secondary | ICD-10-CM | POA: Diagnosis not present

## 2018-06-16 DIAGNOSIS — I10 Essential (primary) hypertension: Secondary | ICD-10-CM | POA: Diagnosis not present

## 2018-06-16 DIAGNOSIS — E782 Mixed hyperlipidemia: Secondary | ICD-10-CM | POA: Diagnosis not present

## 2018-06-16 DIAGNOSIS — N183 Chronic kidney disease, stage 3 (moderate): Secondary | ICD-10-CM | POA: Diagnosis not present

## 2018-06-16 DIAGNOSIS — F325 Major depressive disorder, single episode, in full remission: Secondary | ICD-10-CM | POA: Diagnosis not present

## 2018-07-17 DIAGNOSIS — F325 Major depressive disorder, single episode, in full remission: Secondary | ICD-10-CM | POA: Diagnosis not present

## 2018-07-17 DIAGNOSIS — E782 Mixed hyperlipidemia: Secondary | ICD-10-CM | POA: Diagnosis not present

## 2018-07-17 DIAGNOSIS — I10 Essential (primary) hypertension: Secondary | ICD-10-CM | POA: Diagnosis not present

## 2018-07-17 DIAGNOSIS — N183 Chronic kidney disease, stage 3 (moderate): Secondary | ICD-10-CM | POA: Diagnosis not present

## 2018-08-10 DIAGNOSIS — I10 Essential (primary) hypertension: Secondary | ICD-10-CM | POA: Diagnosis not present

## 2018-08-10 DIAGNOSIS — N183 Chronic kidney disease, stage 3 (moderate): Secondary | ICD-10-CM | POA: Diagnosis not present

## 2018-08-10 DIAGNOSIS — E782 Mixed hyperlipidemia: Secondary | ICD-10-CM | POA: Diagnosis not present

## 2018-08-10 DIAGNOSIS — F325 Major depressive disorder, single episode, in full remission: Secondary | ICD-10-CM | POA: Diagnosis not present

## 2018-09-18 DIAGNOSIS — I10 Essential (primary) hypertension: Secondary | ICD-10-CM | POA: Diagnosis not present

## 2018-09-18 DIAGNOSIS — E782 Mixed hyperlipidemia: Secondary | ICD-10-CM | POA: Diagnosis not present

## 2018-09-18 DIAGNOSIS — N183 Chronic kidney disease, stage 3 (moderate): Secondary | ICD-10-CM | POA: Diagnosis not present

## 2018-09-18 DIAGNOSIS — F325 Major depressive disorder, single episode, in full remission: Secondary | ICD-10-CM | POA: Diagnosis not present

## 2018-10-08 DIAGNOSIS — F332 Major depressive disorder, recurrent severe without psychotic features: Secondary | ICD-10-CM | POA: Diagnosis not present

## 2018-10-12 DIAGNOSIS — I10 Essential (primary) hypertension: Secondary | ICD-10-CM | POA: Diagnosis not present

## 2018-10-12 DIAGNOSIS — N183 Chronic kidney disease, stage 3 (moderate): Secondary | ICD-10-CM | POA: Diagnosis not present

## 2018-10-12 DIAGNOSIS — F325 Major depressive disorder, single episode, in full remission: Secondary | ICD-10-CM | POA: Diagnosis not present

## 2018-10-12 DIAGNOSIS — E782 Mixed hyperlipidemia: Secondary | ICD-10-CM | POA: Diagnosis not present

## 2018-11-06 DIAGNOSIS — F325 Major depressive disorder, single episode, in full remission: Secondary | ICD-10-CM | POA: Diagnosis not present

## 2018-11-06 DIAGNOSIS — N1831 Chronic kidney disease, stage 3a: Secondary | ICD-10-CM | POA: Diagnosis not present

## 2018-11-06 DIAGNOSIS — I10 Essential (primary) hypertension: Secondary | ICD-10-CM | POA: Diagnosis not present

## 2018-11-06 DIAGNOSIS — E782 Mixed hyperlipidemia: Secondary | ICD-10-CM | POA: Diagnosis not present

## 2018-12-06 DIAGNOSIS — F325 Major depressive disorder, single episode, in full remission: Secondary | ICD-10-CM | POA: Diagnosis not present

## 2018-12-06 DIAGNOSIS — N1831 Chronic kidney disease, stage 3a: Secondary | ICD-10-CM | POA: Diagnosis not present

## 2018-12-06 DIAGNOSIS — I10 Essential (primary) hypertension: Secondary | ICD-10-CM | POA: Diagnosis not present

## 2018-12-06 DIAGNOSIS — E782 Mixed hyperlipidemia: Secondary | ICD-10-CM | POA: Diagnosis not present

## 2018-12-31 DIAGNOSIS — F332 Major depressive disorder, recurrent severe without psychotic features: Secondary | ICD-10-CM | POA: Diagnosis not present

## 2019-01-04 DIAGNOSIS — I10 Essential (primary) hypertension: Secondary | ICD-10-CM | POA: Diagnosis not present

## 2019-01-04 DIAGNOSIS — N1831 Chronic kidney disease, stage 3a: Secondary | ICD-10-CM | POA: Diagnosis not present

## 2019-01-04 DIAGNOSIS — F325 Major depressive disorder, single episode, in full remission: Secondary | ICD-10-CM | POA: Diagnosis not present

## 2019-01-04 DIAGNOSIS — E782 Mixed hyperlipidemia: Secondary | ICD-10-CM | POA: Diagnosis not present

## 2019-02-04 DIAGNOSIS — I10 Essential (primary) hypertension: Secondary | ICD-10-CM | POA: Diagnosis not present

## 2019-02-04 DIAGNOSIS — E782 Mixed hyperlipidemia: Secondary | ICD-10-CM | POA: Diagnosis not present

## 2019-02-04 DIAGNOSIS — F325 Major depressive disorder, single episode, in full remission: Secondary | ICD-10-CM | POA: Diagnosis not present

## 2019-03-05 DIAGNOSIS — E782 Mixed hyperlipidemia: Secondary | ICD-10-CM | POA: Diagnosis not present

## 2019-03-05 DIAGNOSIS — I10 Essential (primary) hypertension: Secondary | ICD-10-CM | POA: Diagnosis not present

## 2019-03-05 DIAGNOSIS — F325 Major depressive disorder, single episode, in full remission: Secondary | ICD-10-CM | POA: Diagnosis not present

## 2019-04-07 DIAGNOSIS — F325 Major depressive disorder, single episode, in full remission: Secondary | ICD-10-CM | POA: Diagnosis not present

## 2019-04-07 DIAGNOSIS — I10 Essential (primary) hypertension: Secondary | ICD-10-CM | POA: Diagnosis not present

## 2019-04-07 DIAGNOSIS — E782 Mixed hyperlipidemia: Secondary | ICD-10-CM | POA: Diagnosis not present

## 2019-04-07 DIAGNOSIS — N183 Chronic kidney disease, stage 3 unspecified: Secondary | ICD-10-CM | POA: Diagnosis not present

## 2019-04-13 ENCOUNTER — Emergency Department (HOSPITAL_COMMUNITY): Payer: PPO

## 2019-04-13 ENCOUNTER — Emergency Department (HOSPITAL_COMMUNITY)
Admission: EM | Admit: 2019-04-13 | Discharge: 2019-04-13 | Disposition: A | Discharge status: Home / Self Care | Payer: PPO | Attending: Emergency Medicine | Admitting: Emergency Medicine

## 2019-04-13 ENCOUNTER — Other Ambulatory Visit: Payer: Self-pay

## 2019-04-13 DIAGNOSIS — Z79899 Other long term (current) drug therapy: Secondary | ICD-10-CM | POA: Diagnosis not present

## 2019-04-13 DIAGNOSIS — Y999 Unspecified external cause status: Secondary | ICD-10-CM | POA: Diagnosis not present

## 2019-04-13 DIAGNOSIS — S82851A Displaced trimalleolar fracture of right lower leg, initial encounter for closed fracture: Secondary | ICD-10-CM | POA: Diagnosis not present

## 2019-04-13 DIAGNOSIS — Z7982 Long term (current) use of aspirin: Secondary | ICD-10-CM | POA: Diagnosis not present

## 2019-04-13 DIAGNOSIS — Y929 Unspecified place or not applicable: Secondary | ICD-10-CM | POA: Insufficient documentation

## 2019-04-13 DIAGNOSIS — S99911A Unspecified injury of right ankle, initial encounter: Secondary | ICD-10-CM | POA: Diagnosis present

## 2019-04-13 DIAGNOSIS — W19XXXA Unspecified fall, initial encounter: Secondary | ICD-10-CM

## 2019-04-13 DIAGNOSIS — Y9389 Activity, other specified: Secondary | ICD-10-CM | POA: Insufficient documentation

## 2019-04-13 DIAGNOSIS — S82301A Unspecified fracture of lower end of right tibia, initial encounter for closed fracture: Secondary | ICD-10-CM | POA: Diagnosis not present

## 2019-04-13 DIAGNOSIS — S82391A Other fracture of lower end of right tibia, initial encounter for closed fracture: Secondary | ICD-10-CM | POA: Diagnosis not present

## 2019-04-13 DIAGNOSIS — S82831A Other fracture of upper and lower end of right fibula, initial encounter for closed fracture: Secondary | ICD-10-CM | POA: Diagnosis not present

## 2019-04-13 DIAGNOSIS — W010XXA Fall on same level from slipping, tripping and stumbling without subsequent striking against object, initial encounter: Secondary | ICD-10-CM | POA: Diagnosis not present

## 2019-04-13 MED ORDER — PROPOFOL 10 MG/ML IV BOLUS
INTRAVENOUS | Status: AC | PRN
  Administered 2019-04-13: 20 mg via INTRAVENOUS
  Administered 2019-04-13 (×2): 40 mg via INTRAVENOUS

## 2019-04-13 MED ORDER — PROPOFOL 10 MG/ML IV BOLUS: 100.0000 mg | Freq: Once | INTRAVENOUS | Status: DC

## 2019-04-13 MED ORDER — OXYCODONE-ACETAMINOPHEN 5-325 MG PO TABS
2.0000 | ORAL_TABLET | Freq: Once | ORAL | Status: AC
  Administered 2019-04-13: 18:00:00 1 via ORAL
  Filled 2019-04-13: qty 2

## 2019-04-13 MED ORDER — OXYCODONE-ACETAMINOPHEN 5-325 MG PO TABS: 1.0000 | ORAL_TABLET | Freq: Four times a day (QID) | ORAL | 0 refills | Status: DC | PRN

## 2019-04-13 MED ORDER — PROPOFOL 10 MG/ML IV BOLUS
10.0000 mg | Freq: Once | INTRAVENOUS | Status: AC
  Administered 2019-04-13: 19:00:00 10 mg via INTRAVENOUS

## 2019-04-13 MED ORDER — PROPOFOL 10 MG/ML IV BOLUS
INTRAVENOUS | Status: AC
  Filled 2019-04-13: qty 20

## 2019-04-13 MED ORDER — SODIUM CHLORIDE 0.9 % IV SOLN
INTRAVENOUS | Status: AC | PRN
  Administered 2019-04-13: 10 mL/h via INTRAVENOUS

## 2019-04-13 NOTE — ED Provider Notes (Signed)
[facility] EMERGENCY DEPARTMENT Provider Note   CSN: [id number] Arrival date & time: 04/13/19  1305     History Chief Complaint  Patient presents with  . Fall  . Foot Injury    [name] is a 69 y.o. female.  Patient is a 69 year old female with history of anxiety and depression.  She presents today for evaluation of right ankle pain.  Patient was stepping over a wire today when she caught her foot and fell.  She is complaining of severe pain to the right ankle.  She denies other injury.  She has been unable to bear weight since.  The history is provided by the patient.  Fall This is a new problem. The current episode started less than 1 hour ago. The problem occurs constantly. The problem has not changed since onset.The symptoms are aggravated by walking. Nothing relieves the symptoms. She has tried nothing for the symptoms.       Past Medical History:  Diagnosis Date  . Anxiety   . Chronic back pain   . Depression     There are no problems to display for this patient.   Past Surgical History:  Procedure Laterality Date  . [place]   x2  . COLONOSCOPY       OB History   No obstetric history on file.     Family History  Problem Relation Age of Onset  . Heart Problems Mother   . Brain cancer Father   . Colon cancer Neg Hx   . Stomach cancer Neg Hx   . Esophageal cancer Neg Hx   . Pancreatic cancer Neg Hx   . Rectal cancer Neg Hx     Social History   Tobacco Use  . Smoking status: Never Smoker  . Smokeless tobacco: Never Used  Substance Use Topics  . Alcohol use: No  . Drug use: No    Home Medications Prior to Admission medications   Medication Sig Start Date End Date Taking? Authorizing Provider  aspirin 81 MG tablet Take 160 mg by mouth daily.      [provider]  buPROPion (WELLBUTRIN XL) 300 MG 24 hr tablet Take 300 mg by mouth daily.      [provider]  Calcium Carbonate-Vit  D-Min (CALCIUM 1200 PO) Take by mouth daily.      [provider]  estradiol (ESTRACE) 2 MG tablet Take 2 mg by mouth daily.      [provider]  FLUoxetine (PROZAC) 20 MG tablet Take 50 mg by mouth daily. Takes 2 tablets daily    [provider]  medroxyPROGESTERone (PROVERA) 5 MG tablet Take 1 tablet by mouth every 3 (three) months. 06/22/12   [provider]  Multiple Vitamin (MULTIVITAMIN) tablet Take 1 tablet by mouth daily.      [provider]  omeprazole (PRILOSEC) 20 MG capsule Take 20 mg by mouth daily.    [provider]  rizatriptan (MAXALT) 10 MG tablet Take 1 tablet (10 mg total) by mouth daily as needed. 08/21/12   Star Age, MD  topiramate (TOPAMAX) 100 MG tablet Take 1 tablet (100 mg total) by mouth at bedtime. 08/21/12   Star Age, MD  traZODone (DESYREL) 100 MG tablet Take 100 mg by mouth as needed.     [provider]    Allergies    Sulfur  Review of Systems   Review of Systems  All other systems reviewed and  are negative.   Physical Exam Updated Vital Signs BP (!) 146/75   Pulse 79   Temp 97.8 F (36.6 C) (Oral)   Resp 16   Ht 5\' 5"  (1.651 m)   Wt 61.2 kg   SpO2 99%   BMI 22.47 kg/m   Physical Exam Vitals and nursing note reviewed.  Constitutional:      General: She is not in acute distress.    Appearance: Normal appearance. She is not ill-appearing.  HENT:     Head: Normocephalic and atraumatic.  Pulmonary:     Effort: Pulmonary effort is normal.  Musculoskeletal:     Comments: The right ankle has obvious deformity.  DP pulses are easily palpable and she is able to flex and extend her toes.  Sensation is intact to all toes.  Skin:    General: Skin is warm and dry.  Neurological:     Mental Status: She is alert.     ED Results / Procedures / Treatments   Labs (all labs ordered are listed, but only abnormal results are displayed) Labs Reviewed - No data to  display  EKG None  Radiology DG Ankle Complete Right  Result Date: 04/13/2019 CLINICAL DATA:  Pain following fall EXAM: RIGHT ANKLE - COMPLETE 3+ VIEW COMPARISON:  None. FINDINGS: Frontal, oblique, and lateral views were obtained. There is a comminuted fracture of the distal tibia involving the distal diaphysis medially with fracture extending obliquely to disrupt the tibial plafond. There is a transversely oriented fracture of the distal tibial metaphysis medially with alignment near anatomic in this area. There is an obliquely oriented fracture of the distal fibular diaphysis-metaphysis junction with mild lateral and posterior displacement and angulation distally. There is no appreciable joint effusion. No joint space narrowing or erosion. IMPRESSION: Comminuted fracture distal tibia which extends into the tibial plafond region with disruption of the tibial plafond. There is separation of fracture fragments in this area. There is also a fracture, incomplete, along the medial distal tibia. Comminuted obliquely oriented fracture of the distal fibula with lateral and posterior angulation and displacement distally. Electronically Signed   By: Lowella Grip III M.D.   On: 04/13/2019 14:07   DG Foot Complete Right  Result Date: 04/13/2019 CLINICAL DATA:  Pain following fall EXAM: RIGHT FOOT COMPLETE - 3+ VIEW COMPARISON:  Right ankle April 13, 2019 FINDINGS: Frontal, oblique, and lateral views obtained. Fractures of the distal tibia and fibula, better seen on ankle images. In the foot region, there is no appreciable fracture or dislocation. Ankle mortise disruption noted. Narrowing first MTP joint. Other joint spaces in the foot region appear unremarkable. IMPRESSION: Fractures of the distal tibia and fibula with ankle mortise disruption. No fracture or dislocation in the foot region. There is narrowing of the first MTP joint. Electronically Signed   By: Lowella Grip III M.D.   On: 04/13/2019 14:05     Procedures .Sedation  Date/Time: 04/13/2019 7:57 PM Performed by: Veryl Speak, MD Authorized by: Veryl Speak, MD   Consent:    Consent obtained:  Verbal and written   Consent given by:  Patient   Risks discussed:  Allergic reaction, dysrhythmia, inadequate sedation, nausea, prolonged hypoxia resulting in organ damage, prolonged sedation necessitating reversal, respiratory compromise necessitating ventilatory assistance and intubation and vomiting   Alternatives discussed:  Analgesia without sedation, anxiolysis and regional anesthesia Universal protocol:    Procedure explained and questions answered to patient or proxy's satisfaction: yes     Relevant documents present and  verified: yes     Test results available and properly labeled: yes     Imaging studies available: yes     Required blood products, implants, devices, and special equipment available: yes     Site/side marked: yes     Immediately prior to procedure a time out was called: yes     Patient identity confirmation method:  Verbally with patient Indications:    Procedure necessitating sedation performed by:  Physician performing sedation Pre-sedation assessment:    Time since last food or drink:  6 hours   ASA classification: class 1 - normal, healthy patient     Neck mobility: normal     Mouth opening:  3 or more finger widths   Thyromental distance:  4 finger widths   Mallampati score:  I - soft palate, uvula, fauces, pillars visible   Pre-sedation assessments completed and reviewed: airway patency, cardiovascular function, hydration status, mental status, nausea/vomiting, pain level, respiratory function and temperature   Immediate pre-procedure details:    Reassessment: Patient reassessed immediately prior to procedure     Reviewed: vital signs, relevant labs/tests and NPO status     Verified: bag valve mask available, emergency equipment available, intubation equipment available, IV patency confirmed, oxygen  available and suction available   Procedure details (see MAR for exact dosages):    Preoxygenation:  Nasal cannula   Sedation:  Propofol   Intended level of sedation: deep   Intra-procedure monitoring:  Blood pressure monitoring, cardiac monitor, continuous pulse oximetry, frequent LOC assessments, frequent vital sign checks and continuous capnometry   Intra-procedure events: none     Total Provider sedation time (minutes):  15 Post-procedure details:    Attendance: Constant attendance by certified staff until patient recovered     Recovery: Patient returned to pre-procedure baseline     Post-sedation assessments completed and reviewed: airway patency, cardiovascular function, hydration status, mental status, nausea/vomiting, pain level, respiratory function and temperature     Patient is stable for discharge or admission: yes     Patient tolerance:  Tolerated well, no immediate complications Comments:     Patient required 100 mg of propofol with good sedation achieved.  He tolerated the procedure well with no complications. Reduction of fracture  Date/Time: 04/13/2019 7:58 PM Performed by: Veryl Speak, MD Authorized by: Veryl Speak, MD  Consent: Verbal consent obtained. Written consent obtained. Risks and benefits: risks, benefits and alternatives were discussed Consent given by: patient Patient understanding: patient states understanding of the procedure being performed Patient consent: the patient's understanding of the procedure matches consent given Procedure consent: procedure consent matches procedure scheduled Relevant documents: relevant documents present and verified Test results: test results available and properly labeled Site marked: the operative site was marked Imaging studies: imaging studies available Required items: required blood products, implants, devices, and special equipment available Patient identity confirmed: verbally with patient, arm band,  hospital-assigned identification number and provided demographic data Time out: Immediately prior to procedure a "time out" was called to verify the correct patient, procedure, equipment, support staff and site/side marked as required. Local anesthesia used: no  Anesthesia: Local anesthesia used: no  Sedation: Patient sedated: yes Sedatives: propofol  Patient tolerance: patient tolerated the procedure well with no immediate complications Comments: The ankle fracture was reduced without difficulty.  This was then placed in a posterior splint with stirrup.    (including critical care time)  Medications Ordered in ED Medications  oxyCODONE-acetaminophen (PERCOCET/ROXICET) 5-325 MG per tablet 2 tablet (has no administration  in time range)    ED Course  I have reviewed the triage vital signs and the nursing notes.  Pertinent labs & imaging results that were available during my care of the patient were reviewed by me and considered in my medical decision making (see chart for details).    MDM Rules/Calculators/A&P  Patient presents here after a fall complaining of right ankle pain.  Her x-rays revealed a trimalleolar fracture of the right ankle.  This was reduced under conscious sedation and splinted with a posterior splint/stirrup.  This was done in consultation with Dr. Stann Mainland from orthopedics.  Patient to follow-up in the orthopedic clinic in the next few days.  She is to rest, elevate, ice.  She will also be given medication for pain.  She is to be nonweightbearing.  Final Clinical Impression(s) / ED Diagnoses Final diagnoses:  Fall    Rx / DC Orders ED Discharge Orders    None       Veryl Speak, MD 04/13/19 [phone]

## 2019-04-13 NOTE — ED Triage Notes (Signed)
Pt here from home after mechanical fall while trying to cross a cable. Pain and swelling to R ankle, unable to bear weight. Did not pass out, no other injury from the fall.

## 2019-04-13 NOTE — Sedation Documentation (Signed)
CT called, pt is next on list

## 2019-04-13 NOTE — Progress Notes (Signed)
Orthopedic Tech Progress Note Patient Details:  [name] 08/24/1950 [id number]  Ortho Devices Type of Ortho Device: Short leg splint, Stirrup splint Ortho Device/Splint Location: RLE Ortho Device/Splint Interventions: Application   Post Interventions Patient Tolerated: Well Instructions Provided: Care of device   Linus Salmons Marjan Rosman 04/13/2019, 6:57 PM

## 2019-04-13 NOTE — Sedation Documentation (Signed)
Spouse at bedside

## 2019-04-13 NOTE — Discharge Instructions (Signed)
Begin taking Percocet as prescribed as needed for pain.  No weightbearing on your right leg.  Keep your leg elevated.  Ice for 20 minutes every 2 hours while awake for the next 2 days.  Follow-up with Dr. Stann Mainland in the orthopedic clinic.  His office contact information has been provided in this discharge summary for you to call tomorrow morning and make these arrangements.  Return to the emergency department if you develop worsening leg pain, fevers, or if you feel as though the cast has become too tight.

## 2019-04-22 DIAGNOSIS — M25571 Pain in right ankle and joints of right foot: Secondary | ICD-10-CM | POA: Diagnosis not present

## 2019-04-22 DIAGNOSIS — M79671 Pain in right foot: Secondary | ICD-10-CM | POA: Diagnosis not present

## 2019-04-26 ENCOUNTER — Other Ambulatory Visit: Payer: Self-pay

## 2019-04-26 ENCOUNTER — Other Ambulatory Visit (HOSPITAL_COMMUNITY)
Admission: RE | Admit: 2019-04-26 | Discharge: 2019-04-26 | Disposition: A | Discharge status: Home / Self Care | Payer: PPO | Source: Ambulatory Visit | Attending: Orthopedic Surgery | Admitting: Orthopedic Surgery

## 2019-04-26 ENCOUNTER — Encounter (HOSPITAL_COMMUNITY): Payer: Self-pay | Admitting: Orthopedic Surgery

## 2019-04-26 DIAGNOSIS — Z01812 Encounter for preprocedural laboratory examination: Secondary | ICD-10-CM | POA: Diagnosis not present

## 2019-04-26 DIAGNOSIS — Z20822 Contact with and (suspected) exposure to covid-19: Secondary | ICD-10-CM | POA: Insufficient documentation

## 2019-04-26 LAB — SARS CORONAVIRUS 2 (TAT 6-24 HRS): SARS Coronavirus 2: NEGATIVE

## 2019-04-26 NOTE — Progress Notes (Signed)
Pt denies SOB, chest pain, and being under the care of a cardiologist. Pt stated that PCP is Dr. Lysle Rubens. Pt denies having a stress test, echo and cardiac cath. Pt denies having a chest x ray and EKG in the last year. Pt denies recent labs. Pt made aware to stop taking Aspirin (unless otherwise advised by surgeon), vitamins, fish oil and herbal medications. Do not take any NSAIDs ie: Ibuprofen, Advil, Naproxen (Aleve), Motrin, BC and Goody Powder. Pt reminded to quarantine. Pt verbalized understanding of all pre-op instructions.

## 2019-04-27 ENCOUNTER — Encounter (HOSPITAL_COMMUNITY)
Admission: RE | Disposition: A | Discharge status: Home / Self Care | Payer: Self-pay | Source: Home / Self Care | Attending: Orthopedic Surgery

## 2019-04-27 ENCOUNTER — Ambulatory Visit (HOSPITAL_COMMUNITY): Payer: PPO | Admitting: Anesthesiology

## 2019-04-27 ENCOUNTER — Other Ambulatory Visit: Payer: Self-pay

## 2019-04-27 ENCOUNTER — Encounter (HOSPITAL_COMMUNITY): Payer: Self-pay | Admitting: Orthopedic Surgery

## 2019-04-27 ENCOUNTER — Ambulatory Visit (HOSPITAL_COMMUNITY): Payer: PPO

## 2019-04-27 ENCOUNTER — Observation Stay (HOSPITAL_COMMUNITY)
Admission: RE | Admit: 2019-04-27 | Discharge: 2019-04-27 | Disposition: A | Discharge status: Home / Self Care | Payer: PPO | Attending: Orthopedic Surgery | Admitting: Orthopedic Surgery

## 2019-04-27 DIAGNOSIS — Z9889 Other specified postprocedural states: Secondary | ICD-10-CM

## 2019-04-27 DIAGNOSIS — Z8781 Personal history of (healed) traumatic fracture: Secondary | ICD-10-CM

## 2019-04-27 DIAGNOSIS — S8261XA Displaced fracture of lateral malleolus of right fibula, initial encounter for closed fracture: Secondary | ICD-10-CM | POA: Diagnosis not present

## 2019-04-27 DIAGNOSIS — F419 Anxiety disorder, unspecified: Secondary | ICD-10-CM | POA: Diagnosis not present

## 2019-04-27 DIAGNOSIS — Z79899 Other long term (current) drug therapy: Secondary | ICD-10-CM | POA: Diagnosis not present

## 2019-04-27 DIAGNOSIS — F418 Other specified anxiety disorders: Secondary | ICD-10-CM | POA: Diagnosis not present

## 2019-04-27 DIAGNOSIS — F329 Major depressive disorder, single episode, unspecified: Secondary | ICD-10-CM | POA: Insufficient documentation

## 2019-04-27 DIAGNOSIS — X58XXXA Exposure to other specified factors, initial encounter: Secondary | ICD-10-CM | POA: Diagnosis not present

## 2019-04-27 DIAGNOSIS — K219 Gastro-esophageal reflux disease without esophagitis: Secondary | ICD-10-CM | POA: Insufficient documentation

## 2019-04-27 DIAGNOSIS — G8918 Other acute postprocedural pain: Secondary | ICD-10-CM | POA: Diagnosis not present

## 2019-04-27 DIAGNOSIS — I10 Essential (primary) hypertension: Secondary | ICD-10-CM | POA: Diagnosis not present

## 2019-04-27 DIAGNOSIS — Z419 Encounter for procedure for purposes other than remedying health state, unspecified: Secondary | ICD-10-CM

## 2019-04-27 DIAGNOSIS — S82851A Displaced trimalleolar fracture of right lower leg, initial encounter for closed fracture: Principal | ICD-10-CM | POA: Diagnosis present

## 2019-04-27 HISTORY — DX: Headache, unspecified: R51.9

## 2019-04-27 HISTORY — DX: Other fracture of right lower leg, initial encounter for closed fracture: S82.891A

## 2019-04-27 HISTORY — PX: ORIF ANKLE FRACTURE: SHX5408

## 2019-04-27 HISTORY — DX: Essential (primary) hypertension: I10

## 2019-04-27 HISTORY — DX: Gastro-esophageal reflux disease without esophagitis: K21.9

## 2019-04-27 HISTORY — DX: Unspecified abdominal hernia without obstruction or gangrene: K46.9

## 2019-04-27 LAB — BASIC METABOLIC PANEL
Anion gap: 11 (ref 5–15)
BUN: 12 mg/dL (ref 8–23)
CO2: 20 mmol/L — ABNORMAL LOW (ref 22–32)
Calcium: 9 mg/dL (ref 8.9–10.3)
Chloride: 108 mmol/L (ref 98–111)
Creatinine, Ser: 1.07 mg/dL — ABNORMAL HIGH (ref 0.44–1.00)
GFR calc Af Amer: >60 mL/min (ref >60)
GFR calc non Af Amer: 52 mL/min — ABNORMAL LOW (ref >60)
Glucose, Bld: 92 mg/dL (ref 70–99)
Potassium: 4.1 mmol/L (ref 3.5–5.1)
Sodium: 139 mmol/L (ref 135–145)

## 2019-04-27 LAB — CBC
HCT: 35.8 % — ABNORMAL LOW (ref 36.0–46.0)
Hemoglobin: 11.4 g/dL — ABNORMAL LOW (ref 12.0–15.0)
MCH: 31.4 pg (ref 26.0–34.0)
MCHC: 31.8 g/dL (ref 30.0–36.0)
MCV: 98.6 fL (ref 80.0–100.0)
Platelets: 507 10*3/uL — ABNORMAL HIGH (ref 150–400)
RBC: 3.63 MIL/uL — ABNORMAL LOW (ref 3.87–5.11)
RDW: 12.9 % (ref 11.5–15.5)
WBC: 11.9 10*3/uL — ABNORMAL HIGH (ref 4.0–10.5)
nRBC: 0 % (ref 0.0–0.2)

## 2019-04-27 LAB — SURGICAL PCR SCREEN
MRSA, PCR: NEGATIVE
Staphylococcus aureus: POSITIVE — AB

## 2019-04-27 SURGERY — OPEN REDUCTION INTERNAL FIXATION (ORIF) ANKLE FRACTURE
Anesthesia: General | Site: Ankle | Laterality: Right

## 2019-04-27 MED ORDER — CEFAZOLIN SODIUM-DEXTROSE 2-4 GM/100ML-% IV SOLN
2.0000 g | INTRAVENOUS | Status: AC
  Administered 2019-04-27: 16:00:00 2 g via INTRAVENOUS
  Filled 2019-04-27: qty 100

## 2019-04-27 MED ORDER — EPHEDRINE SULFATE-NACL 50-0.9 MG/10ML-% IV SOSY
PREFILLED_SYRINGE | INTRAVENOUS | Status: DC | PRN
  Administered 2019-04-27: 10 mg via INTRAVENOUS

## 2019-04-27 MED ORDER — PROMETHAZINE HCL 25 MG/ML IJ SOLN: 6.2500 mg | INTRAMUSCULAR | Status: DC | PRN

## 2019-04-27 MED ORDER — CELECOXIB 200 MG PO CAPS
200.0000 mg | ORAL_CAPSULE | Freq: Once | ORAL | Status: AC
  Administered 2019-04-27: 200 mg via ORAL
  Filled 2019-04-27: qty 1

## 2019-04-27 MED ORDER — BUPIVACAINE HCL (PF) 0.25 % IJ SOLN
INTRAMUSCULAR | Status: DC | PRN
  Administered 2019-04-27: 20 mL via EPIDURAL
  Administered 2019-04-27: 15 mL via EPIDURAL

## 2019-04-27 MED ORDER — PHENYLEPHRINE 40 MCG/ML (10ML) SYRINGE FOR IV PUSH (FOR BLOOD PRESSURE SUPPORT)
PREFILLED_SYRINGE | INTRAVENOUS | Status: AC
  Filled 2019-04-27: qty 20

## 2019-04-27 MED ORDER — PHENYLEPHRINE 40 MCG/ML (10ML) SYRINGE FOR IV PUSH (FOR BLOOD PRESSURE SUPPORT)
PREFILLED_SYRINGE | INTRAVENOUS | Status: DC | PRN
  Administered 2019-04-27 (×5): 80 ug via INTRAVENOUS

## 2019-04-27 MED ORDER — FENTANYL CITRATE (PF) 100 MCG/2ML IJ SOLN: 25.0000 ug | INTRAMUSCULAR | Status: DC | PRN

## 2019-04-27 MED ORDER — ROCURONIUM BROMIDE 10 MG/ML (PF) SYRINGE
PREFILLED_SYRINGE | INTRAVENOUS | Status: AC
  Filled 2019-04-27: qty 10

## 2019-04-27 MED ORDER — ROCURONIUM BROMIDE 10 MG/ML (PF) SYRINGE
PREFILLED_SYRINGE | INTRAVENOUS | Status: DC | PRN
  Administered 2019-04-27: 50 mg via INTRAVENOUS

## 2019-04-27 MED ORDER — FENTANYL CITRATE (PF) 100 MCG/2ML IJ SOLN
INTRAMUSCULAR | Status: AC
  Administered 2019-04-27: 100 ug via INTRAVENOUS
  Filled 2019-04-27: qty 2

## 2019-04-27 MED ORDER — ACETAMINOPHEN 500 MG PO TABS
1000.0000 mg | ORAL_TABLET | Freq: Once | ORAL | Status: AC
  Administered 2019-04-27: 1000 mg via ORAL
  Filled 2019-04-27: qty 2

## 2019-04-27 MED ORDER — BUPIVACAINE LIPOSOME 1.3 % IJ SUSP
INTRAMUSCULAR | Status: DC | PRN
  Administered 2019-04-27: 10 mL

## 2019-04-27 MED ORDER — PROPOFOL 10 MG/ML IV BOLUS
INTRAVENOUS | Status: DC | PRN
  Administered 2019-04-27: 90 mg via INTRAVENOUS

## 2019-04-27 MED ORDER — ONDANSETRON HCL 4 MG/2ML IJ SOLN
INTRAMUSCULAR | Status: AC
  Filled 2019-04-27: qty 2

## 2019-04-27 MED ORDER — SUGAMMADEX SODIUM 200 MG/2ML IV SOLN
INTRAVENOUS | Status: DC | PRN
  Administered 2019-04-27: 100 mg via INTRAVENOUS

## 2019-04-27 MED ORDER — MIDAZOLAM HCL 2 MG/2ML IJ SOLN
INTRAMUSCULAR | Status: AC
  Administered 2019-04-27: 2 mg via INTRAVENOUS
  Filled 2019-04-27: qty 2

## 2019-04-27 MED ORDER — LACTATED RINGERS IV SOLN: INTRAVENOUS | Status: DC

## 2019-04-27 MED ORDER — ONDANSETRON HCL 4 MG/2ML IJ SOLN
INTRAMUSCULAR | Status: DC | PRN
  Administered 2019-04-27: 4 mg via INTRAVENOUS

## 2019-04-27 MED ORDER — DEXAMETHASONE SODIUM PHOSPHATE 10 MG/ML IJ SOLN
INTRAMUSCULAR | Status: DC | PRN
  Administered 2019-04-27: 4 mg via INTRAVENOUS

## 2019-04-27 MED ORDER — FENTANYL CITRATE (PF) 100 MCG/2ML IJ SOLN: 100.0000 ug | Freq: Once | INTRAMUSCULAR | Status: AC

## 2019-04-27 MED ORDER — OXYCODONE HCL 5 MG PO TABS: 5.0000 mg | ORAL_TABLET | ORAL | 0 refills | Status: AC | PRN

## 2019-04-27 MED ORDER — LIDOCAINE 2% (20 MG/ML) 5 ML SYRINGE
INTRAMUSCULAR | Status: AC
  Filled 2019-04-27: qty 5

## 2019-04-27 MED ORDER — MIDAZOLAM HCL 2 MG/2ML IJ SOLN
INTRAMUSCULAR | Status: AC
  Filled 2019-04-27: qty 2

## 2019-04-27 MED ORDER — EPHEDRINE 5 MG/ML INJ
INTRAVENOUS | Status: AC
  Filled 2019-04-27: qty 10

## 2019-04-27 MED ORDER — PROPOFOL 10 MG/ML IV BOLUS
INTRAVENOUS | Status: AC
  Filled 2019-04-27: qty 20

## 2019-04-27 MED ORDER — 0.9 % SODIUM CHLORIDE (POUR BTL) OPTIME
TOPICAL | Status: DC | PRN
  Administered 2019-04-27: 17:00:00 1000 mL

## 2019-04-27 MED ORDER — MIDAZOLAM HCL 2 MG/2ML IJ SOLN: 2.0000 mg | Freq: Once | INTRAMUSCULAR | Status: AC

## 2019-04-27 MED ORDER — FENTANYL CITRATE (PF) 250 MCG/5ML IJ SOLN
INTRAMUSCULAR | Status: AC
  Filled 2019-04-27: qty 5

## 2019-04-27 MED ORDER — FENTANYL CITRATE (PF) 100 MCG/2ML IJ SOLN
INTRAMUSCULAR | Status: DC | PRN
  Administered 2019-04-27 (×2): 50 ug via INTRAVENOUS

## 2019-04-27 MED ORDER — LIDOCAINE 2% (20 MG/ML) 5 ML SYRINGE
INTRAMUSCULAR | Status: DC | PRN
  Administered 2019-04-27: 40 mg via INTRAVENOUS

## 2019-04-27 MED ORDER — ONDANSETRON 4 MG PO TBDP: 4.0000 mg | ORAL_TABLET | Freq: Three times a day (TID) | ORAL | 0 refills | Status: DC | PRN

## 2019-04-27 SURGICAL SUPPLY — 75 items
ALCOHOL 70% 16 OZ (MISCELLANEOUS) ×3 IMPLANT
BANDAGE ESMARK 6X9 LF (GAUZE/BANDAGES/DRESSINGS) IMPLANT
BIT DRILL 2.5X110 QC LCP DISP (BIT) ×2 IMPLANT
BIT DRILL QC 3.5X110 (BIT) ×2 IMPLANT
BNDG CMPR 9X6 STRL LF SNTH (GAUZE/BANDAGES/DRESSINGS)
BNDG COHESIVE 4X5 TAN STRL (GAUZE/BANDAGES/DRESSINGS) ×3 IMPLANT
BNDG ELASTIC 4X5.8 VLCR STR LF (GAUZE/BANDAGES/DRESSINGS) ×2 IMPLANT
BNDG ELASTIC 6X5.8 VLCR STR LF (GAUZE/BANDAGES/DRESSINGS) ×2 IMPLANT
BNDG ESMARK 6X9 LF (GAUZE/BANDAGES/DRESSINGS)
CANISTER SUCT 3000ML PPV (MISCELLANEOUS) ×3 IMPLANT
COVER SURGICAL LIGHT HANDLE (MISCELLANEOUS) ×3 IMPLANT
COVER WAND RF STERILE (DRAPES) ×1 IMPLANT
CUFF TOURN SGL QUICK 34 (TOURNIQUET CUFF) ×3
CUFF TRNQT CYL 34X4.125X (TOURNIQUET CUFF) IMPLANT
DRAPE C-ARM 42X72 X-RAY (DRAPES) ×3 IMPLANT
DRAPE C-ARMOR (DRAPES) ×3 IMPLANT
DRAPE U-SHAPE 47X51 STRL (DRAPES) ×3 IMPLANT
DRSG ADAPTIC 3X8 NADH LF (GAUZE/BANDAGES/DRESSINGS) ×1 IMPLANT
DRSG PAD ABDOMINAL 8X10 ST (GAUZE/BANDAGES/DRESSINGS) ×3 IMPLANT
DURAPREP 26ML APPLICATOR (WOUND CARE) ×5 IMPLANT
ELECT REM PT RETURN 9FT ADLT (ELECTROSURGICAL) ×3
ELECTRODE REM PT RTRN 9FT ADLT (ELECTROSURGICAL) ×1 IMPLANT
GAUZE SPONGE 4X4 12PLY STRL (GAUZE/BANDAGES/DRESSINGS) ×3 IMPLANT
GAUZE XEROFORM 5X9 LF (GAUZE/BANDAGES/DRESSINGS) ×2 IMPLANT
GLOVE BIO SURGEON STRL SZ7.5 (GLOVE) ×3 IMPLANT
GLOVE BIOGEL PI IND STRL 8 (GLOVE) ×1 IMPLANT
GLOVE BIOGEL PI INDICATOR 8 (GLOVE) ×2
GOWN STRL REUS W/ TWL LRG LVL3 (GOWN DISPOSABLE) ×2 IMPLANT
GOWN STRL REUS W/ TWL XL LVL3 (GOWN DISPOSABLE) ×1 IMPLANT
GOWN STRL REUS W/TWL LRG LVL3 (GOWN DISPOSABLE) ×6
GOWN STRL REUS W/TWL XL LVL3 (GOWN DISPOSABLE) ×3
KIT BASIN OR (CUSTOM PROCEDURE TRAY) ×3 IMPLANT
KIT TURNOVER KIT B (KITS) ×3 IMPLANT
NDL HYPO 25GX1X1/2 BEV (NEEDLE) IMPLANT
NEEDLE HYPO 25GX1X1/2 BEV (NEEDLE) IMPLANT
NS IRRIG 1000ML POUR BTL (IV SOLUTION) ×3 IMPLANT
PACK ORTHO EXTREMITY (CUSTOM PROCEDURE TRAY) ×3 IMPLANT
PAD ARMBOARD 7.5X6 YLW CONV (MISCELLANEOUS) ×6 IMPLANT
PAD CAST 4YDX4 CTTN HI CHSV (CAST SUPPLIES) ×2 IMPLANT
PADDING CAST COTTON 4X4 STRL (CAST SUPPLIES) ×3
PADDING CAST COTTON 6X4 STRL (CAST SUPPLIES) ×3 IMPLANT
PLATE LCP 3.5 1/3 TUB 5HX57 (Plate) ×4 IMPLANT
PLATE LCP 3.5 1/3 TUB 7HX81 (Plate) ×2 IMPLANT
SCREW CORT 3.5X44M SELF TAP (Screw) ×2 IMPLANT
SCREW CORT 3.5X46M SELF TAP (Screw) ×2 IMPLANT
SCREW CORTEX 3.5 12MM (Screw) ×4 IMPLANT
SCREW CORTEX 3.5 18MM (Screw) ×2 IMPLANT
SCREW CORTEX 3.5 22MM (Screw) ×2 IMPLANT
SCREW CORTEX 3.5 24MM (Screw) ×4 IMPLANT
SCREW CORTEX 3.5 26MM (Screw) ×6 IMPLANT
SCREW CORTEX 3.5 32MM (Screw) ×4 IMPLANT
SCREW CORTEX 3.5 38MM (Screw) ×2 IMPLANT
SCREW CORTEX 3.5 40MM (Screw) ×2 IMPLANT
SCREW LOCK CORT ST 3.5X12 (Screw) IMPLANT
SCREW LOCK CORT ST 3.5X18 (Screw) IMPLANT
SCREW LOCK CORT ST 3.5X22 (Screw) IMPLANT
SCREW LOCK CORT ST 3.5X24 (Screw) IMPLANT
SCREW LOCK CORT ST 3.5X26 (Screw) IMPLANT
SCREW LOCK CORT ST 3.5X32 (Screw) IMPLANT
SCREW LOCK CORT ST 3.5X38 (Screw) IMPLANT
SCREW LOCK CORT ST 3.5X40 (Screw) IMPLANT
SPLINT FIBERGLASS 4X30 (CAST SUPPLIES) ×2 IMPLANT
SPONGE LAP 18X18 RF (DISPOSABLE) IMPLANT
SUCTION FRAZIER HANDLE 10FR (MISCELLANEOUS) ×3
SUCTION TUBE FRAZIER 10FR DISP (MISCELLANEOUS) ×1 IMPLANT
SUT ETHILON 3 0 PS 1 (SUTURE) ×13 IMPLANT
SUT VIC AB 0 CT1 27 (SUTURE)
SUT VIC AB 0 CT1 27XBRD ANBCTR (SUTURE) IMPLANT
SUT VIC AB 2-0 CT1 27 (SUTURE) ×15
SUT VIC AB 2-0 CT1 TAPERPNT 27 (SUTURE) ×1 IMPLANT
SYR CONTROL 10ML LL (SYRINGE) IMPLANT
TOWEL GREEN STERILE (TOWEL DISPOSABLE) ×6 IMPLANT
TUBE CONNECTING 12'X1/4 (SUCTIONS) ×1
TUBE CONNECTING 12X1/4 (SUCTIONS) ×2 IMPLANT
YANKAUER SUCT BULB TIP NO VENT (SUCTIONS) ×3 IMPLANT

## 2019-04-27 NOTE — Transfer of Care (Signed)
Immediate Anesthesia Transfer of Care Note  Patient: [name]  Procedure(s) Performed: OPEN REDUCTION INTERNAL FIXATION (ORIF) ANKLE FRACTURE (Right Ankle)  Patient Location: PACU  Anesthesia Type:GA combined with regional for post-op pain  Level of Consciousness: drowsy and patient cooperative  Airway & Oxygen Therapy: Patient Spontanous Breathing and Patient connected to nasal cannula oxygen  Post-op Assessment: Report given to RN, Post -op Vital signs reviewed and stable and Patient moving all extremities  Post vital signs: Reviewed and stable  Last Vitals:  Vitals Value Taken Time  BP 132/69 04/27/19 1826  Temp    Pulse 73 04/27/19 1827  Resp 14 04/27/19 1827  SpO2 100 % 04/27/19 1827  Vitals shown include unvalidated device data.  Last Pain:  Vitals:   04/27/19 1355  TempSrc:   PainSc: 0-No pain         Complications: No apparent anesthesia complications

## 2019-04-27 NOTE — Brief Op Note (Signed)
04/27/2019  6:24 PM  PATIENT:  [name]  69 y.o. female  PRE-OPERATIVE DIAGNOSIS:  Right ankle trimalleolar fracture  POST-OPERATIVE DIAGNOSIS:  Right ankle trimalleolar fracture  PROCEDURE:  Procedure(s) with comments: OPEN REDUCTION INTERNAL FIXATION (ORIF) ANKLE FRACTURE (Right) - 2 hrs RNFA  SURGEON:  Surgeon(s) and Role:    * Stann Mainland, Elly Modena, MD - Primary  PHYSICIAN ASSISTANT:   ASSISTANTS: Katy Apo, RNFA   ANESTHESIA:   regional and general  EBL:  25 mL   BLOOD ADMINISTERED:none  DRAINS: none   LOCAL MEDICATIONS USED:  NONE  SPECIMEN:  No Specimen  DISPOSITION OF SPECIMEN:  N/A  COUNTS:  YES  TOURNIQUET:   Total Tourniquet Time Documented: Thigh (Right) - 102 minutes Total: Thigh (Right) - 102 minutes   DICTATION: .Note written in EPIC  PLAN OF CARE: Discharge to home after PACU  PATIENT DISPOSITION:  PACU - hemodynamically stable.   Delay start of Pharmacological VTE agent (>24hrs) due to surgical blood loss or risk of bleeding: not applicable

## 2019-04-27 NOTE — Anesthesia Procedure Notes (Signed)
Procedure Name: Intubation Date/Time: 04/27/2019 4:08 PM Performed by: Janene Harvey, CRNA Pre-anesthesia Checklist: Patient identified, Emergency Drugs available, Suction available and Patient being monitored Patient Re-evaluated:Patient Re-evaluated prior to induction Oxygen Delivery Method: Circle system utilized Preoxygenation: Pre-oxygenation with 100% oxygen Induction Type: IV induction Ventilation: Mask ventilation without difficulty Laryngoscope Size: Mac and 3 Grade View: Grade I Tube type: Oral Tube size: 7.0 mm Number of attempts: 1 Airway Equipment and Method: Stylet and Oral airway Placement Confirmation: ETT inserted through vocal cords under direct vision,  positive ETCO2 and breath sounds checked- equal and bilateral Secured at: 22 cm Tube secured with: Tape Dental Injury: Teeth and Oropharynx as per pre-operative assessment  Comments: Intubation by [facility]

## 2019-04-27 NOTE — H&P (Signed)
ORTHOPAEDIC H and P  REQUESTING PHYSICIAN: Nicholes Stairs, MD  PCP:  Wenda Low, MD  Chief Complaint: Right ankle fracture  HPI: [name] is a 69 y.o. female who complains of right ankle pain following a ground-level fall earlier in March.  She sustained a trimalleolar ankle fracture with dislocation.  She was closed reduced initially and has been waiting for her soft tissues to resolve some of the edema and be, more appropriate for surgery.  She is here today for the definitive treatment.  No new complaints or follow-up in the interval.  Past Medical History:  Diagnosis Date  . Abdominal hernia   . Anxiety   . Chronic back pain   . Closed right ankle fracture   . Depression   . GERD (gastroesophageal reflux disease)   . Headache    migraine  . Hypertension    Past Surgical History:  Procedure Laterality Date  . [place]   x2  . COLONOSCOPY    . WISDOM TOOTH EXTRACTION     Social History   Socioeconomic History  . Marital status: Widowed    Spouse name: Not on file  . Number of children: 2  . Years of education: 12th  . Highest education level: Not on file  Occupational History  . Occupation: Retired  Tobacco Use  . Smoking status: Never Smoker  . Smokeless tobacco: Never Used  Substance and Sexual Activity  . Alcohol use: No  . Drug use: No  . Sexual activity: Not on file  Other Topics Concern  . Not on file  Social History Narrative   Patient lives at home alone.   Caffeine Use: 1 soda daily   Social Determinants of Health   Financial Resource Strain:   . Difficulty of Paying Living Expenses:   Food Insecurity:   . Worried About Charity fundraiser in the Last Year:   . Arboriculturist in the Last Year:   Transportation Needs:   . Film/video editor (Medical):   Marland Kitchen Lack of Transportation (Non-Medical):   Physical Activity:   . Days of Exercise per Week:   . Minutes of Exercise per Session:   Stress:   .  Feeling of Stress :   Social Connections:   . Frequency of Communication with Friends and Family:   . Frequency of Social Gatherings with Friends and Family:   . Attends Religious Services:   . Active Member of Clubs or Organizations:   . Attends Archivist Meetings:   Marland Kitchen Marital Status:    Family History  Problem Relation Age of Onset  . Heart Problems Mother   . Brain cancer Father   . Colon cancer Neg Hx   . Stomach cancer Neg Hx   . Esophageal cancer Neg Hx   . Pancreatic cancer Neg Hx   . Rectal cancer Neg Hx    Allergies  Allergen Reactions  . Sulfur Nausea And Vomiting and Rash   Prior to Admission medications   Medication Sig Start Date End Date Taking? Authorizing Provider  amLODipine (NORVASC) 5 MG tablet Take 5 mg by mouth daily.  04/08/19  Yes [provider]  buPROPion (WELLBUTRIN XL) 300 MG 24 hr tablet Take 300 mg by mouth daily after breakfast.    Yes [provider]  Calcium Carb-Cholecalciferol (CALCIUM + D3 PO) Take 1 tablet by mouth every evening.   Yes [provider]  FLUoxetine (PROZAC) 40 MG  capsule Take 80 mg by mouth daily after breakfast.  04/08/19  Yes [provider]  Multiple Vitamin (MULTIVITAMIN WITH MINERALS) TABS tablet Take 1 tablet by mouth every evening.   Yes [provider]  omeprazole (PRILOSEC) 20 MG capsule Take 20 mg by mouth daily before breakfast.    Yes [provider]  rizatriptan (MAXALT) 10 MG tablet Take 1 tablet (10 mg total) by mouth daily as needed. Patient taking differently: Take 10 mg by mouth every 2 (two) hours as needed for migraine.  08/21/12  Yes Star Age, MD  topiramate (TOPAMAX) 100 MG tablet Take 1 tablet (100 mg total) by mouth at bedtime. 08/21/12  Yes Star Age, MD  oxyCODONE-acetaminophen (PERCOCET) 5-325 MG tablet Take 1-2 tablets by mouth every 6 (six) hours as needed. Patient not taking: Reported on 04/23/2019 04/13/19   Veryl Speak, MD   No  results found.  Positive ROS: All other systems have been reviewed and were otherwise negative with the exception of those mentioned in the HPI and as above.  Physical Exam: General: Alert, no acute distress Cardiovascular: No pedal edema Respiratory: No cyanosis, no use of accessory musculature GI: No organomegaly, abdomen is soft and non-tender Skin: No lesions in the area of chief complaint Neurologic: Sensation intact distally Psychiatric: Patient is competent for consent with normal mood and affect Lymphatic: No axillary or cervical lymphadenopathy  MUSCULOSKELETAL:  Right lower extremity:  Splint in place.  Positive wrinkle sign.  Neurovascular intact.  Assessment: Closed right trimalleolar ankle fracture  Plan: -Our plan today is to proceed with definitive internal fixation of the trimalleolar fracture with dislocation.  She will be admitted postoperatively for routine perioperative care.  -The risks, benefits, and alternatives were discussed with the patient. There are risks associated with the surgery including, but not limited to, problems with anesthesia (death), infection, differences in leg length/angulation/rotation, fracture of bones, loosening or failure of implants, malunion, nonunion, hematoma (blood accumulation) which may require surgical drainage, blood clots, pulmonary embolism, nerve injury (foot drop), and blood vessel injury. The patient understands these risks and elects to proceed.     Nicholes Stairs, MD Cell [phone]    04/27/2019 3:20 PM

## 2019-04-27 NOTE — Anesthesia Procedure Notes (Signed)
Anesthesia Regional Block: Popliteal block   Pre-Anesthetic Checklist: ,, timeout performed, Correct Patient, Correct Site, Correct Laterality, Correct Procedure, Correct Position, site marked, Risks and benefits discussed,  Surgical consent,  Pre-op evaluation,  At surgeon's request and post-op pain management  Laterality: Right  Prep: chloraprep       Needles:  Injection technique: Single-shot  Needle Type: Echogenic Stimulator Needle          Additional Needles:   Narrative:  Start time: 04/27/2019 2:11 PM End time: 04/27/2019 2:21 PM Injection made incrementally with aspirations every 5 mL.  Performed by: Personally  Anesthesiologist: Duane Boston, MD  Additional Notes: A functioning IV was confirmed and monitors were applied.  Sterile prep and drape, hand hygiene and sterile gloves were used.  Negative aspiration and test dose prior to incremental administration of local anesthetic. The patient tolerated the procedure well.Ultrasound  guidance: relevant anatomy identified, needle position confirmed, local anesthetic spread visualized around nerve(s), vascular puncture avoided.  Image printed for medical record.

## 2019-04-27 NOTE — Discharge Instructions (Signed)
-  Strictly nonweightbearing to the right lower extremity at all times.  -You should elevate your right lower extremity with your "toes above nose."  At all times and when able.  -Maintain your splint clean and dry at all times.  If this becomes wet or saturated you should call our office.  -For the prevention of blood clots you will take a 325 mg aspirin once per day for 6 weeks.  -For mild to moderate pain use Tylenol and Advil around-the-clock, alternating, every 6 hours.  For breakthrough pain use oxycodone as needed.  -Return to see Dr. Stann Mainland in 2 weeks for routine postop care.

## 2019-04-27 NOTE — Anesthesia Preprocedure Evaluation (Addendum)
Anesthesia Evaluation  Patient identified by MRN, date of birth, ID band Patient awake    Reviewed: Allergy & Precautions, NPO status , Patient's Chart, lab work & pertinent test results  History of Anesthesia Complications Negative for: history of anesthetic complications  Airway Mallampati: II  TM Distance: >3 FB     Dental  (+) Upper Dentures, Lower Dentures   Pulmonary neg pulmonary ROS,    Pulmonary exam normal        Cardiovascular hypertension, Pt. on medications Normal cardiovascular exam     Neuro/Psych PSYCHIATRIC DISORDERS Anxiety Depression negative neurological ROS     GI/Hepatic Neg liver ROS, GERD  ,  Endo/Other  negative endocrine ROS  Renal/GU negative Renal ROS     Musculoskeletal negative musculoskeletal ROS (+)   Abdominal   Peds  Hematology negative hematology ROS (+)   Anesthesia Other Findings Day of surgery medications reviewed with the patient.  Reproductive/Obstetrics                           Anesthesia Physical Anesthesia Plan  ASA: II  Anesthesia Plan: General   Post-op Pain Management:  Regional for Post-op pain   Induction: Intravenous  PONV Risk Score and Plan: 3 and Ondansetron, Propofol infusion and Diphenhydramine  Airway Management Planned: Oral ETT  Additional Equipment:   Intra-op Plan:   Post-operative Plan: Extubation in OR  Informed Consent: I have reviewed the patients History and Physical, chart, labs and discussed the procedure including the risks, benefits and alternatives for the proposed anesthesia with the patient or authorized representative who has indicated his/her understanding and acceptance.     Dental advisory given  Plan Discussed with: Anesthesiologist and CRNA  Anesthesia Plan Comments:        Anesthesia Quick Evaluation

## 2019-04-27 NOTE — Anesthesia Postprocedure Evaluation (Signed)
Anesthesia Post Note  Patient: [name]  Procedure(s) Performed: OPEN REDUCTION INTERNAL FIXATION (ORIF) ANKLE FRACTURE (Right Ankle)     Anesthesia Post Evaluation  Last Vitals:  Vitals:   04/27/19 1915 04/27/19 1930  BP: 131/71 129/72  Pulse: 76 73  Resp: 17 15  Temp:  (!) 36.3 C  SpO2: 97% 96%    Last Pain:  Vitals:   04/27/19 1930  TempSrc:   PainSc: 0-No pain                 Nolon Nations

## 2019-04-27 NOTE — Op Note (Signed)
Date of Surgery: 04/27/2019  INDICATIONS: [name] is a 69 y.o.-year-old female who sustained a right ankle fracture; she was indicated for open reduction and internal fixation due to the displaced nature of the articular fracture and came to the operating room today for this procedure. The patient did consent to the procedure after discussion of the risks and benefits.  PREOPERATIVE DIAGNOSIS: right trimalleolar ankle fracture  POSTOPERATIVE DIAGNOSIS: Same.  PROCEDURE: Open treatment of right ankle fracture with internal fixation.  Trimalleolar w/ fixation of posterior malleolus CPT 27823.  SURGEON: [name], M.D.  ASSIST: [name], RNFA.  ANESTHESIA:  general, with regional  TOURNIQUET TIME: 104 minutes at 300 mm Hg  IV FLUIDS AND URINE: See anesthesia.  ESTIMATED BLOOD LOSS: 20  mL.  IMPLANTS:  Synthes 5 hole 1/3 tubular plate x 2 with 3.5 mm screws Synthes 7 hole 1/3 tubular plate x 1 with 3.5 mm screws  COMPLICATIONS: None.  DESCRIPTION OF PROCEDURE: The patient was brought to the operating room and placed supine on the operating table.  The patient had been signed prior to the procedure and this was documented. The patient had the anesthesia placed by the anesthesiologist.  A nonsterile tourniquet was placed on the upper thigh.  The prep verification and incision time-outs were performed to confirm that this was the correct patient, site, side and location. The patient had an SCD on the opposite lower extremity. The patient did receive antibiotics prior to the incision and was re-dosed during the procedure as needed at indicated intervals.  The patient had the lower extremity prepped and draped in the standard surgical fashion.  The extremity was exsanguinated using an esmarch bandage and the tourniquet was inflated to 300 mm Hg.   We began with a posterior lateral approach to the distal fibula and distal tibia.  Longitudinal incision was made equidistant between  the Achilles tendon and posterior border of the distal fibula.  Skin was incised sharply and then we bluntly dissected down to the fascia overlying the peroneal tendons.  This was divided and the peroneal tendons were retracted laterally to access the flexor halluses longus.  This was elevated off of the posterior aspect of the syndesmosis and posterior aspect of the distal tibia.  This allowed access to the posterior lateral plafond fragment.  We did note that between the time of CT scan and today's surgery the tibia of talar joint had subluxated.  The posterior malleolar fragment was now posteriorly translated and the talus was shortened.  We were unable to obtain the length we needed to fixate the posterior lateral tibia fragment.  We then turned our attention to the distal fibula.    Utilizing a joker and key elevator we opened the distal fibula fragment.  We were able to then obtain better access to the posterior lateral tibia fragment.  This was likewise freed up of any unorganized hematoma and periosteum.  This allowed for better mobilization of all fragments.  We then anatomically reduced to the distal fibula with 2 clamps.  We placed 2 interfragmentary screws by technique.  These had good purchase and compression.  We then applied a 7 hole one third tubular Synthes plate to the posterior aspect of the distal fibula.  We applied 6 nonlocking 3.5 mm cortical screws.  We then obtained intraoperative AP lateral fluoroscopic images to confirm the appropriate reduction and position of the hardware.    We then turned our attention back to the posterior lateral distal tibia  fragment.  Now with the fibula out to length via ligamentotaxis the articular block had been brought back down to anatomic position.  We then placed an antiglide plate along the posterior surface of the tibia.  We first placed the 3.5 mm cortical screw at the apex of the fracture.  This had excellent purchase.  We then placed 2  subsequent 3.5 millimeter screws into the articular block through the plate.  These also had nice purchase.  Lastly, we turned our attention to the medial malleolar fracture.  This was a long spike to the diaphysis on the medial side in the sagittal plane.  This was a vertically unstable fracture and this was treated with a buttress plate.  We applied a longitudinal incision along the medial subcutaneous border of the tibia.  We dissected down to the level of the apex of the fracture.  We cleared periosteum and interposed hematoma from the fracture site.  We then applied a 5 hole one third tubular plate along the medial border of the tibia.  We first placed 2 proximal screws, first with the apex screw and then a subsequent screw to allow further compression of the plate against the medial fracture fragment.  We then placed a independent compression screw across the plafond via technique with good compression.  Finally when the last screw was placed in the articular block on the medial side.    Following tightening of all screws we then obtained final fluoroscopic images both AP, lateral, internal rotation oblique, and stress view.  These were stable.   The syndesmosis was stressed using live fluoroscopy and found to be stable.   The wounds were irrigated, and closed with vicryl with routine closure for the skin with 3-0 nylon. The wounds were injected with local anesthetic. Sterile gauze was applied followed by a posterior splint. She was awakened and returned to the PACU in stable and satisfactory condition. There were no complications.    POSTOPERATIVE PLAN: [name] will remain nonweightbearing on this leg for approximately 6 weeks; [name] will return for suture removal in 2 weeks.  He will be immobilized in a short leg splint and then transitioned to a CAM walker at his first follow up appointment.  [name] will receive DVT prophylaxis based on other medications, activity level, and risk  ratio of bleeding to thrombosis.  Alice Rile, MD [facility] [place] [phone] 6:25 PM

## 2019-04-28 DIAGNOSIS — S82891D Other fracture of right lower leg, subsequent encounter for closed fracture with routine healing: Secondary | ICD-10-CM | POA: Diagnosis not present

## 2019-04-28 DIAGNOSIS — I10 Essential (primary) hypertension: Secondary | ICD-10-CM | POA: Diagnosis not present

## 2019-04-28 DIAGNOSIS — F419 Anxiety disorder, unspecified: Secondary | ICD-10-CM | POA: Diagnosis not present

## 2019-04-29 ENCOUNTER — Encounter: Payer: Self-pay | Admitting: *Deleted

## 2019-05-03 NOTE — Discharge Summary (Signed)
Patient ID: MAESYN [name] MRN: [id number] DOB/AGE: 1950-10-17 69 y.o.  Admit date: 04/27/2019 Discharge date: 04/27/2019  Primary Diagnosis: Right trimalleolar ankle fracture  Admission Diagnoses:  Past Medical History:  Diagnosis Date  . Abdominal hernia   . Anxiety   . Chronic back pain   . Closed right ankle fracture   . Depression   . GERD (gastroesophageal reflux disease)   . Headache    migraine  . Hypertension    Discharge Diagnoses:   Active Problems:   Status post ORIF of fracture of ankle   Closed displaced trimalleolar fracture of right ankle  Estimated body mass index is 22.47 kg/m as calculated from the following:   Height as of this encounter: 5\' 5"  (1.651 m).   Weight as of this encounter: 61.2 kg.  Procedure:  Procedure(s) (LRB): OPEN REDUCTION INTERNAL FIXATION (ORIF) ANKLE FRACTURE (Right)   Consults: None  HPI: [name] presented to the hospital for elective internal fixation of her unstable right ankle fracture. Laboratory Data: Admission on 04/27/2019, Discharged on 04/27/2019  Component Date Value Ref Range Status  . WBC 04/27/2019 11.9* 4.0 - 10.5 K/uL Final  . RBC 04/27/2019 3.63* 3.87 - 5.11 MIL/uL Final  . Hemoglobin 04/27/2019 11.4* 12.0 - 15.0 g/dL Final  . HCT 04/27/2019 35.8* 36.0 - 46.0 % Final  . MCV 04/27/2019 98.6  80.0 - 100.0 fL Final  . MCH 04/27/2019 31.4  26.0 - 34.0 pg Final  . MCHC 04/27/2019 31.8  30.0 - 36.0 g/dL Final  . RDW 04/27/2019 12.9  11.5 - 15.5 % Final  . Platelets 04/27/2019 507* 150 - 400 K/uL Final  . nRBC 04/27/2019 0.0  0.0 - 0.2 % Final   Performed at [facility] [address], [place], [place] [address]  . Sodium 04/27/2019 139  135 - 145 mmol/L Final  . Potassium 04/27/2019 4.1  3.5 - 5.1 mmol/L Final  . Chloride 04/27/2019 108  98 - 111 mmol/L Final  . CO2 04/27/2019 20* 22 - 32 mmol/L Final  . Glucose, Bld 04/27/2019 92  70 - 99 mg/dL Final   Glucose reference range applies only to samples  taken after fasting for at least 8 hours.  . BUN 04/27/2019 12  8 - 23 mg/dL Final  . Creatinine, Ser 04/27/2019 1.07* 0.44 - 1.00 mg/dL Final  . Calcium 04/27/2019 9.0  8.9 - 10.3 mg/dL Final  . GFR calc non Af Amer 04/27/2019 52* >60 mL/min Final  . GFR calc Af Amer 04/27/2019 >60  >60 mL/min Final  . Anion gap 04/27/2019 11  5 - 15 Final   Performed at [facility], [place] [address] [place], [place] [address]  . MRSA, PCR 04/27/2019 NEGATIVE  NEGATIVE Final  . Staphylococcus aureus 04/27/2019 POSITIVE* NEGATIVE Final   Comment: (NOTE) The Xpert SA Assay (FDA approved for NASAL specimens in patients 19 years of age and older), is one component of a comprehensive surveillance program. It is not intended to diagnose infection nor to guide or monitor treatment. Performed at [facility], [place] [address], [place], [place] [address]   Hospital Outpatient Visit on 04/26/2019  Component Date Value Ref Range Status  . SARS Coronavirus 2 04/26/2019 NEGATIVE  NEGATIVE Final   Comment: (NOTE) SARS-CoV-2 target nucleic acids are NOT DETECTED. The SARS-CoV-2 RNA is generally detectable in upper and lower respiratory specimens during the acute phase of infection. Negative results do not preclude SARS-CoV-2 infection, do not rule out  co-infections with other pathogens, and should not be used as the sole basis for treatment or other patient management decisions. Negative results must be combined with clinical observations, patient history, and epidemiological information. The expected result is Negative. Fact Sheet for Patients: SugarRoll.be Fact Sheet for Healthcare Providers: https://www.woods-mathews.com/ This test is not yet approved or cleared by the [place] FDA and  has been authorized for detection and/or diagnosis of SARS-CoV-2 by FDA under an Emergency Use Authorization (EUA). This EUA will remain  in effect (meaning this test  can be used) for the duration of the COVID-19 declaration under Section 56                          4(b)(1) of the Act, 21 U.S.C. section 360bbb-3(b)(1), unless the authorization is terminated or revoked sooner. Performed at [facility], [place] [address] [place], [place] [address]      X-Rays:DG Ankle Complete Right  Result Date: 04/27/2019 CLINICAL DATA:  Open reduction internal fixation of the right ankle. EXAM: RIGHT ANKLE - COMPLETE 3+ VIEW COMPARISON:  None. FINDINGS: Acute fracture deformities are seen involving the right lateral, posterior and medial malleoli. A long radiopaque fixation plate and multiple fixation screws are placed along the right lateral malleolus with two additional radiopaque fixation plates and screws placed along the distal right tibia. Alignment of the fracture sites is seen. The right ankle mortise is intact. There is no evidence of arthropathy or other focal bone abnormality. Mild diffuse soft tissue swelling is noted. IMPRESSION: Status post open reduction and internal fixation of acute fractures of the right lateral malleolus and distal right tibia. Electronically Signed   By: Virgina Norfolk M.D.   On: 04/27/2019 19:34   DG Ankle Complete Right  Result Date: 04/13/2019 CLINICAL DATA:  Pain following fall EXAM: RIGHT ANKLE - COMPLETE 3+ VIEW COMPARISON:  None. FINDINGS: Frontal, oblique, and lateral views were obtained. There is a comminuted fracture of the distal tibia involving the distal diaphysis medially with fracture extending obliquely to disrupt the tibial plafond. There is a transversely oriented fracture of the distal tibial metaphysis medially with alignment near anatomic in this area. There is an obliquely oriented fracture of the distal fibular diaphysis-metaphysis junction with mild lateral and posterior displacement and angulation distally. There is no appreciable joint effusion. No joint space narrowing or erosion. IMPRESSION: Comminuted  fracture distal tibia which extends into the tibial plafond region with disruption of the tibial plafond. There is separation of fracture fragments in this area. There is also a fracture, incomplete, along the medial distal tibia. Comminuted obliquely oriented fracture of the distal fibula with lateral and posterior angulation and displacement distally. Electronically Signed   By: Lowella Grip III M.D.   On: 04/13/2019 14:07   CT Ankle Right Wo Contrast  Result Date: 04/13/2019 CLINICAL DATA:  Fracture dislocation of the right ankle. Status post reduction. EXAM: CT OF THE RIGHT ANKLE WITHOUT CONTRAST TECHNIQUE: Multidetector CT imaging of the right ankle was performed according to the standard protocol. Multiplanar CT image reconstructions were also generated. COMPARISON:  Radiographs dated 04/13/2019 FINDINGS: Bones/Joint/Cartilage There is a trimalleolar fracture. The dislocation has been reduced. The fracture through the medial malleolus is primarily sagittal, extending 6 cm proximal to the tibiotalar articulation. There is also a nondisplaced horizontal fracture through the anterior aspect medial aspect of the distal tibia, best seen on image 21 of series 6 and on image 32 of series 4.  There is a minimally distracted fracture of the posterior malleolus involving approximately 1/3 of the articular surface of the distal tibia. There is a 2 mm step-off in the articular surface of the distal tibia best seen on image 27 of series 6. There is a oblique coronal spiral fracture of the distal fibular shaft with minimal distraction. The fracture does extend into the distal tibiofibular articulation. Ligaments The ligaments are not well enough seen for assessment. Muscles and Tendons No tendon entrapment. Soft tissues Soft tissue swelling anteriorly and laterally in the subcutaneous fat. IMPRESSION: Trimalleolar fracture as described.  Reduction of the dislocation. Electronically Signed   By: Lorriane Shire M.D.    On: 04/13/2019 19:46   DG Foot Complete Right  Result Date: 04/13/2019 CLINICAL DATA:  Pain following fall EXAM: RIGHT FOOT COMPLETE - 3+ VIEW COMPARISON:  Right ankle April 13, 2019 FINDINGS: Frontal, oblique, and lateral views obtained. Fractures of the distal tibia and fibula, better seen on ankle images. In the foot region, there is no appreciable fracture or dislocation. Ankle mortise disruption noted. Narrowing first MTP joint. Other joint spaces in the foot region appear unremarkable. IMPRESSION: Fractures of the distal tibia and fibula with ankle mortise disruption. No fracture or dislocation in the foot region. There is narrowing of the first MTP joint. Electronically Signed   By: Lowella Grip III M.D.   On: 04/13/2019 14:05   DG C-Arm 1-60 Min  Result Date: 04/27/2019 CLINICAL DATA:  Open reduction and internal fixation of the right ankle. EXAM: DG C-ARM 1-60 MIN CONTRAST:  None FLUOROSCOPY TIME:  Fluoroscopy Time:  22.3 seconds Radiation Exposure Index (if provided by the fluoroscopic device): N/A Number of Acquired Spot Images: 8 COMPARISON:  None. FINDINGS: Acute fracture deformities are seen involving the right lateral, posterior and medial malleoli. A long radiopaque fixation plate and multiple fixation screws are placed along the right lateral malleolus with two additional radiopaque fixation plates and screws placed along the distal right tibia. Alignment of the fracture sites is seen. The right ankle mortise is intact. There is no evidence of arthropathy or other focal bone abnormality. Mild diffuse soft tissue swelling is noted. IMPRESSION: Status post open reduction and internal fixation of right ankle fractures. Electronically Signed   By: Virgina Norfolk M.D.   On: 04/27/2019 19:39    EKG:No orders found for this or any previous visit.   Hospital Course: [name] is a 69 y.o. who was admitted to Hospital. They were brought to the operating room on 04/27/2019 and underwent  Procedure(s): OPEN REDUCTION INTERNAL FIXATION (ORIF) ANKLE FRACTURE.  Patient tolerated the procedure well and was later transferred to the recovery room Anti-infectives (From admission, onward)   Start     Dose/Rate Route Frequency Ordered Stop   04/28/19 0600  ceFAZolin (ANCEF) IVPB 2g/100 mL premix     2 g 200 mL/hr over 30 Minutes Intravenous On call to O.R. 04/27/19 1305 04/27/19 1627     and started on DVT prophylaxis in the form of Aspirin.  D/c 'd home from PACU.   Diet: Regular diet Activity:NWB Follow-up:in 2 weeks Disposition - Home Discharged Condition: good   Discharge Instructions    Call MD / Call 911   Complete by: As directed    If you experience chest pain or shortness of breath, CALL 911 and be transported to the hospital emergency room.  If you develope a fever above 101 F, pus (white drainage) or increased drainage or redness at  the wound, or calf pain, call your surgeon's office.   Constipation Prevention   Complete by: As directed    Drink plenty of fluids.  Prune juice may be helpful.  You may use a stool softener, such as Colace (over the counter) 100 mg twice a day.  Use MiraLax (over the counter) for constipation as needed.   Diet - low sodium heart healthy   Complete by: As directed    Increase activity slowly as tolerated   Complete by: As directed      Allergies as of 04/27/2019      Reactions   Sulfur Nausea And Vomiting, Rash      Medication List    STOP taking these medications   oxyCODONE-acetaminophen 5-325 MG tablet Commonly known as: Percocet     TAKE these medications   amLODipine 5 MG tablet Commonly known as: NORVASC Take 5 mg by mouth daily.   buPROPion 300 MG 24 hr tablet Commonly known as: WELLBUTRIN XL Take 300 mg by mouth daily after breakfast.   CALCIUM + D3 PO Take 1 tablet by mouth every evening.   FLUoxetine 40 MG capsule Commonly known as: PROZAC Take 80 mg by mouth daily after breakfast.   multivitamin with  minerals Tabs tablet Take 1 tablet by mouth every evening.   omeprazole 20 MG capsule Commonly known as: PRILOSEC Take 20 mg by mouth daily before breakfast.   ondansetron 4 MG disintegrating tablet Commonly known as: Zofran ODT Take 1 tablet (4 mg total) by mouth every 8 (eight) hours as needed for nausea or vomiting.   oxyCODONE 5 MG immediate release tablet Commonly known as: Roxicodone Take 1 tablet (5 mg total) by mouth every 4 (four) hours as needed for severe pain.   rizatriptan 10 MG tablet Commonly known as: MAXALT Take 1 tablet (10 mg total) by mouth daily as needed. What changed:   when to take this  reasons to take this   topiramate 100 MG tablet Commonly known as: TOPAMAX Take 1 tablet (100 mg total) by mouth at bedtime.      Follow-up Information    Nicholes Stairs, MD In 2 weeks.   Specialty: Orthopedic Surgery Why: For suture removal Contact information: [address] [place] [address] [phone]           Signed: [name], MD Orthopaedic Surgery 05/03/2019, 5:17 PM   a

## 2019-05-06 DIAGNOSIS — F325 Major depressive disorder, single episode, in full remission: Secondary | ICD-10-CM | POA: Diagnosis not present

## 2019-05-06 DIAGNOSIS — E782 Mixed hyperlipidemia: Secondary | ICD-10-CM | POA: Diagnosis not present

## 2019-05-06 DIAGNOSIS — N183 Chronic kidney disease, stage 3 unspecified: Secondary | ICD-10-CM | POA: Diagnosis not present

## 2019-05-06 DIAGNOSIS — I10 Essential (primary) hypertension: Secondary | ICD-10-CM | POA: Diagnosis not present

## 2019-05-12 DIAGNOSIS — Z4789 Encounter for other orthopedic aftercare: Secondary | ICD-10-CM | POA: Diagnosis not present

## 2019-05-26 DIAGNOSIS — Z4789 Encounter for other orthopedic aftercare: Secondary | ICD-10-CM | POA: Diagnosis not present

## 2019-05-26 DIAGNOSIS — M25571 Pain in right ankle and joints of right foot: Secondary | ICD-10-CM | POA: Diagnosis not present

## 2019-06-02 DIAGNOSIS — S82891D Other fracture of right lower leg, subsequent encounter for closed fracture with routine healing: Secondary | ICD-10-CM | POA: Diagnosis not present

## 2019-06-02 DIAGNOSIS — I10 Essential (primary) hypertension: Secondary | ICD-10-CM | POA: Diagnosis not present

## 2019-06-02 DIAGNOSIS — F419 Anxiety disorder, unspecified: Secondary | ICD-10-CM | POA: Diagnosis not present

## 2019-06-03 DIAGNOSIS — F325 Major depressive disorder, single episode, in full remission: Secondary | ICD-10-CM | POA: Diagnosis not present

## 2019-06-03 DIAGNOSIS — N183 Chronic kidney disease, stage 3 unspecified: Secondary | ICD-10-CM | POA: Diagnosis not present

## 2019-06-03 DIAGNOSIS — E782 Mixed hyperlipidemia: Secondary | ICD-10-CM | POA: Diagnosis not present

## 2019-06-03 DIAGNOSIS — I10 Essential (primary) hypertension: Secondary | ICD-10-CM | POA: Diagnosis not present

## 2019-06-10 DIAGNOSIS — I10 Essential (primary) hypertension: Secondary | ICD-10-CM | POA: Diagnosis not present

## 2019-06-10 DIAGNOSIS — S82891D Other fracture of right lower leg, subsequent encounter for closed fracture with routine healing: Secondary | ICD-10-CM | POA: Diagnosis not present

## 2019-06-10 DIAGNOSIS — F419 Anxiety disorder, unspecified: Secondary | ICD-10-CM | POA: Diagnosis not present

## 2019-06-22 DIAGNOSIS — F419 Anxiety disorder, unspecified: Secondary | ICD-10-CM | POA: Diagnosis not present

## 2019-06-22 DIAGNOSIS — S82891D Other fracture of right lower leg, subsequent encounter for closed fracture with routine healing: Secondary | ICD-10-CM | POA: Diagnosis not present

## 2019-06-22 DIAGNOSIS — I10 Essential (primary) hypertension: Secondary | ICD-10-CM | POA: Diagnosis not present

## 2019-06-23 DIAGNOSIS — M25571 Pain in right ankle and joints of right foot: Secondary | ICD-10-CM | POA: Diagnosis not present

## 2019-06-23 DIAGNOSIS — Z4789 Encounter for other orthopedic aftercare: Secondary | ICD-10-CM | POA: Diagnosis not present

## 2019-06-24 DIAGNOSIS — F419 Anxiety disorder, unspecified: Secondary | ICD-10-CM | POA: Diagnosis not present

## 2019-06-24 DIAGNOSIS — I10 Essential (primary) hypertension: Secondary | ICD-10-CM | POA: Diagnosis not present

## 2019-06-24 DIAGNOSIS — S82891D Other fracture of right lower leg, subsequent encounter for closed fracture with routine healing: Secondary | ICD-10-CM | POA: Diagnosis not present

## 2019-06-29 DIAGNOSIS — M25571 Pain in right ankle and joints of right foot: Secondary | ICD-10-CM | POA: Diagnosis not present

## 2019-06-30 DIAGNOSIS — F332 Major depressive disorder, recurrent severe without psychotic features: Secondary | ICD-10-CM | POA: Diagnosis not present

## 2019-07-01 DIAGNOSIS — F325 Major depressive disorder, single episode, in full remission: Secondary | ICD-10-CM | POA: Diagnosis not present

## 2019-07-01 DIAGNOSIS — E782 Mixed hyperlipidemia: Secondary | ICD-10-CM | POA: Diagnosis not present

## 2019-07-01 DIAGNOSIS — I10 Essential (primary) hypertension: Secondary | ICD-10-CM | POA: Diagnosis not present

## 2019-07-01 DIAGNOSIS — N183 Chronic kidney disease, stage 3 unspecified: Secondary | ICD-10-CM | POA: Diagnosis not present

## 2019-07-02 DIAGNOSIS — M25571 Pain in right ankle and joints of right foot: Secondary | ICD-10-CM | POA: Diagnosis not present

## 2019-07-06 DIAGNOSIS — M25571 Pain in right ankle and joints of right foot: Secondary | ICD-10-CM | POA: Diagnosis not present

## 2019-07-09 DIAGNOSIS — M25571 Pain in right ankle and joints of right foot: Secondary | ICD-10-CM | POA: Diagnosis not present

## 2019-07-16 DIAGNOSIS — M25571 Pain in right ankle and joints of right foot: Secondary | ICD-10-CM | POA: Diagnosis not present

## 2019-07-20 DIAGNOSIS — M25571 Pain in right ankle and joints of right foot: Secondary | ICD-10-CM | POA: Diagnosis not present

## 2019-07-22 DIAGNOSIS — M25571 Pain in right ankle and joints of right foot: Secondary | ICD-10-CM | POA: Diagnosis not present

## 2019-08-03 DIAGNOSIS — F325 Major depressive disorder, single episode, in full remission: Secondary | ICD-10-CM | POA: Diagnosis not present

## 2019-08-03 DIAGNOSIS — I10 Essential (primary) hypertension: Secondary | ICD-10-CM | POA: Diagnosis not present

## 2019-08-03 DIAGNOSIS — N183 Chronic kidney disease, stage 3 unspecified: Secondary | ICD-10-CM | POA: Diagnosis not present

## 2019-08-03 DIAGNOSIS — E782 Mixed hyperlipidemia: Secondary | ICD-10-CM | POA: Diagnosis not present

## 2019-08-03 DIAGNOSIS — M25571 Pain in right ankle and joints of right foot: Secondary | ICD-10-CM | POA: Diagnosis not present

## 2019-08-04 DIAGNOSIS — M25571 Pain in right ankle and joints of right foot: Secondary | ICD-10-CM | POA: Diagnosis not present

## 2019-08-09 DIAGNOSIS — M79671 Pain in right foot: Secondary | ICD-10-CM | POA: Diagnosis not present

## 2019-08-12 DIAGNOSIS — M79671 Pain in right foot: Secondary | ICD-10-CM | POA: Diagnosis not present

## 2019-08-18 DIAGNOSIS — M25571 Pain in right ankle and joints of right foot: Secondary | ICD-10-CM | POA: Diagnosis not present

## 2019-08-18 DIAGNOSIS — M79671 Pain in right foot: Secondary | ICD-10-CM | POA: Diagnosis not present

## 2019-08-20 DIAGNOSIS — M25571 Pain in right ankle and joints of right foot: Secondary | ICD-10-CM | POA: Diagnosis not present

## 2019-08-20 DIAGNOSIS — M79671 Pain in right foot: Secondary | ICD-10-CM | POA: Diagnosis not present

## 2019-09-07 ENCOUNTER — Other Ambulatory Visit: Payer: Self-pay | Admitting: Internal Medicine

## 2019-09-07 DIAGNOSIS — M25571 Pain in right ankle and joints of right foot: Secondary | ICD-10-CM | POA: Diagnosis not present

## 2019-09-07 DIAGNOSIS — M8588 Other specified disorders of bone density and structure, other site: Secondary | ICD-10-CM

## 2019-09-07 DIAGNOSIS — M79671 Pain in right foot: Secondary | ICD-10-CM | POA: Diagnosis not present

## 2019-09-09 DIAGNOSIS — M79671 Pain in right foot: Secondary | ICD-10-CM | POA: Diagnosis not present

## 2019-09-09 DIAGNOSIS — M25571 Pain in right ankle and joints of right foot: Secondary | ICD-10-CM | POA: Diagnosis not present

## 2019-09-13 DIAGNOSIS — M25571 Pain in right ankle and joints of right foot: Secondary | ICD-10-CM | POA: Diagnosis not present

## 2019-09-13 DIAGNOSIS — M79671 Pain in right foot: Secondary | ICD-10-CM | POA: Diagnosis not present

## 2019-09-15 DIAGNOSIS — F332 Major depressive disorder, recurrent severe without psychotic features: Secondary | ICD-10-CM | POA: Diagnosis not present

## 2019-09-16 DIAGNOSIS — M79671 Pain in right foot: Secondary | ICD-10-CM | POA: Diagnosis not present

## 2019-09-16 DIAGNOSIS — M25571 Pain in right ankle and joints of right foot: Secondary | ICD-10-CM | POA: Diagnosis not present

## 2019-09-17 DIAGNOSIS — M25571 Pain in right ankle and joints of right foot: Secondary | ICD-10-CM | POA: Diagnosis not present

## 2019-09-21 DIAGNOSIS — E782 Mixed hyperlipidemia: Secondary | ICD-10-CM | POA: Diagnosis not present

## 2019-09-21 DIAGNOSIS — N183 Chronic kidney disease, stage 3 unspecified: Secondary | ICD-10-CM | POA: Diagnosis not present

## 2019-09-21 DIAGNOSIS — F325 Major depressive disorder, single episode, in full remission: Secondary | ICD-10-CM | POA: Diagnosis not present

## 2019-09-21 DIAGNOSIS — I10 Essential (primary) hypertension: Secondary | ICD-10-CM | POA: Diagnosis not present

## 2019-10-04 DIAGNOSIS — Z1231 Encounter for screening mammogram for malignant neoplasm of breast: Secondary | ICD-10-CM | POA: Diagnosis not present

## 2019-10-04 DIAGNOSIS — Z6823 Body mass index (BMI) 23.0-23.9, adult: Secondary | ICD-10-CM | POA: Diagnosis not present

## 2019-10-04 DIAGNOSIS — Z01419 Encounter for gynecological examination (general) (routine) without abnormal findings: Secondary | ICD-10-CM | POA: Diagnosis not present

## 2019-10-20 DIAGNOSIS — Z1389 Encounter for screening for other disorder: Secondary | ICD-10-CM | POA: Diagnosis not present

## 2019-10-20 DIAGNOSIS — N183 Chronic kidney disease, stage 3 unspecified: Secondary | ICD-10-CM | POA: Diagnosis not present

## 2019-10-20 DIAGNOSIS — I10 Essential (primary) hypertension: Secondary | ICD-10-CM | POA: Diagnosis not present

## 2019-10-20 DIAGNOSIS — K219 Gastro-esophageal reflux disease without esophagitis: Secondary | ICD-10-CM | POA: Diagnosis not present

## 2019-10-20 DIAGNOSIS — M8580 Other specified disorders of bone density and structure, unspecified site: Secondary | ICD-10-CM | POA: Diagnosis not present

## 2019-10-20 DIAGNOSIS — R519 Headache, unspecified: Secondary | ICD-10-CM | POA: Diagnosis not present

## 2019-10-20 DIAGNOSIS — Z23 Encounter for immunization: Secondary | ICD-10-CM | POA: Diagnosis not present

## 2019-10-20 DIAGNOSIS — Z0000 Encounter for general adult medical examination without abnormal findings: Secondary | ICD-10-CM | POA: Diagnosis not present

## 2019-10-20 DIAGNOSIS — F331 Major depressive disorder, recurrent, moderate: Secondary | ICD-10-CM | POA: Diagnosis not present

## 2019-10-20 DIAGNOSIS — E782 Mixed hyperlipidemia: Secondary | ICD-10-CM | POA: Diagnosis not present

## 2019-10-21 DIAGNOSIS — F325 Major depressive disorder, single episode, in full remission: Secondary | ICD-10-CM | POA: Diagnosis not present

## 2019-10-21 DIAGNOSIS — I10 Essential (primary) hypertension: Secondary | ICD-10-CM | POA: Diagnosis not present

## 2019-10-21 DIAGNOSIS — F331 Major depressive disorder, recurrent, moderate: Secondary | ICD-10-CM | POA: Diagnosis not present

## 2019-10-21 DIAGNOSIS — E782 Mixed hyperlipidemia: Secondary | ICD-10-CM | POA: Diagnosis not present

## 2019-10-21 DIAGNOSIS — N183 Chronic kidney disease, stage 3 unspecified: Secondary | ICD-10-CM | POA: Diagnosis not present

## 2019-11-02 DIAGNOSIS — I10 Essential (primary) hypertension: Secondary | ICD-10-CM | POA: Diagnosis not present

## 2019-11-02 DIAGNOSIS — E782 Mixed hyperlipidemia: Secondary | ICD-10-CM | POA: Diagnosis not present

## 2019-11-02 DIAGNOSIS — N183 Chronic kidney disease, stage 3 unspecified: Secondary | ICD-10-CM | POA: Diagnosis not present

## 2019-11-02 DIAGNOSIS — F331 Major depressive disorder, recurrent, moderate: Secondary | ICD-10-CM | POA: Diagnosis not present

## 2019-11-02 DIAGNOSIS — F325 Major depressive disorder, single episode, in full remission: Secondary | ICD-10-CM | POA: Diagnosis not present

## 2019-12-03 DIAGNOSIS — F419 Anxiety disorder, unspecified: Secondary | ICD-10-CM | POA: Diagnosis not present

## 2019-12-03 DIAGNOSIS — F332 Major depressive disorder, recurrent severe without psychotic features: Secondary | ICD-10-CM | POA: Diagnosis not present

## 2019-12-12 DIAGNOSIS — E782 Mixed hyperlipidemia: Secondary | ICD-10-CM | POA: Diagnosis not present

## 2019-12-12 DIAGNOSIS — F331 Major depressive disorder, recurrent, moderate: Secondary | ICD-10-CM | POA: Diagnosis not present

## 2019-12-12 DIAGNOSIS — K219 Gastro-esophageal reflux disease without esophagitis: Secondary | ICD-10-CM | POA: Diagnosis not present

## 2019-12-12 DIAGNOSIS — I10 Essential (primary) hypertension: Secondary | ICD-10-CM | POA: Diagnosis not present

## 2019-12-12 DIAGNOSIS — N183 Chronic kidney disease, stage 3 unspecified: Secondary | ICD-10-CM | POA: Diagnosis not present

## 2019-12-12 DIAGNOSIS — F325 Major depressive disorder, single episode, in full remission: Secondary | ICD-10-CM | POA: Diagnosis not present

## 2020-01-06 DIAGNOSIS — E782 Mixed hyperlipidemia: Secondary | ICD-10-CM | POA: Diagnosis not present

## 2020-01-06 DIAGNOSIS — K219 Gastro-esophageal reflux disease without esophagitis: Secondary | ICD-10-CM | POA: Diagnosis not present

## 2020-01-06 DIAGNOSIS — I10 Essential (primary) hypertension: Secondary | ICD-10-CM | POA: Diagnosis not present

## 2020-01-06 DIAGNOSIS — F331 Major depressive disorder, recurrent, moderate: Secondary | ICD-10-CM | POA: Diagnosis not present

## 2020-01-06 DIAGNOSIS — N183 Chronic kidney disease, stage 3 unspecified: Secondary | ICD-10-CM | POA: Diagnosis not present

## 2020-01-06 DIAGNOSIS — F325 Major depressive disorder, single episode, in full remission: Secondary | ICD-10-CM | POA: Diagnosis not present

## 2020-02-21 DIAGNOSIS — K219 Gastro-esophageal reflux disease without esophagitis: Secondary | ICD-10-CM | POA: Diagnosis not present

## 2020-02-21 DIAGNOSIS — F325 Major depressive disorder, single episode, in full remission: Secondary | ICD-10-CM | POA: Diagnosis not present

## 2020-02-21 DIAGNOSIS — E782 Mixed hyperlipidemia: Secondary | ICD-10-CM | POA: Diagnosis not present

## 2020-02-21 DIAGNOSIS — I10 Essential (primary) hypertension: Secondary | ICD-10-CM | POA: Diagnosis not present

## 2020-02-21 DIAGNOSIS — N183 Chronic kidney disease, stage 3 unspecified: Secondary | ICD-10-CM | POA: Diagnosis not present

## 2020-02-29 DIAGNOSIS — F419 Anxiety disorder, unspecified: Secondary | ICD-10-CM | POA: Diagnosis not present

## 2020-02-29 DIAGNOSIS — F332 Major depressive disorder, recurrent severe without psychotic features: Secondary | ICD-10-CM | POA: Diagnosis not present

## 2020-03-03 DIAGNOSIS — N183 Chronic kidney disease, stage 3 unspecified: Secondary | ICD-10-CM | POA: Diagnosis not present

## 2020-03-03 DIAGNOSIS — F325 Major depressive disorder, single episode, in full remission: Secondary | ICD-10-CM | POA: Diagnosis not present

## 2020-03-03 DIAGNOSIS — E782 Mixed hyperlipidemia: Secondary | ICD-10-CM | POA: Diagnosis not present

## 2020-03-03 DIAGNOSIS — I10 Essential (primary) hypertension: Secondary | ICD-10-CM | POA: Diagnosis not present

## 2020-03-03 DIAGNOSIS — K219 Gastro-esophageal reflux disease without esophagitis: Secondary | ICD-10-CM | POA: Diagnosis not present

## 2020-03-31 DIAGNOSIS — E782 Mixed hyperlipidemia: Secondary | ICD-10-CM | POA: Diagnosis not present

## 2020-03-31 DIAGNOSIS — I10 Essential (primary) hypertension: Secondary | ICD-10-CM | POA: Diagnosis not present

## 2020-03-31 DIAGNOSIS — K219 Gastro-esophageal reflux disease without esophagitis: Secondary | ICD-10-CM | POA: Diagnosis not present

## 2020-03-31 DIAGNOSIS — N183 Chronic kidney disease, stage 3 unspecified: Secondary | ICD-10-CM | POA: Diagnosis not present

## 2020-03-31 DIAGNOSIS — F331 Major depressive disorder, recurrent, moderate: Secondary | ICD-10-CM | POA: Diagnosis not present

## 2020-03-31 DIAGNOSIS — F325 Major depressive disorder, single episode, in full remission: Secondary | ICD-10-CM | POA: Diagnosis not present

## 2020-04-26 DIAGNOSIS — N183 Chronic kidney disease, stage 3 unspecified: Secondary | ICD-10-CM | POA: Diagnosis not present

## 2020-04-26 DIAGNOSIS — K219 Gastro-esophageal reflux disease without esophagitis: Secondary | ICD-10-CM | POA: Diagnosis not present

## 2020-04-26 DIAGNOSIS — E782 Mixed hyperlipidemia: Secondary | ICD-10-CM | POA: Diagnosis not present

## 2020-04-26 DIAGNOSIS — I10 Essential (primary) hypertension: Secondary | ICD-10-CM | POA: Diagnosis not present

## 2020-04-26 DIAGNOSIS — F325 Major depressive disorder, single episode, in full remission: Secondary | ICD-10-CM | POA: Diagnosis not present

## 2020-05-08 DIAGNOSIS — N958 Other specified menopausal and perimenopausal disorders: Secondary | ICD-10-CM | POA: Diagnosis not present

## 2020-05-22 DIAGNOSIS — F419 Anxiety disorder, unspecified: Secondary | ICD-10-CM | POA: Diagnosis not present

## 2020-05-22 DIAGNOSIS — F332 Major depressive disorder, recurrent severe without psychotic features: Secondary | ICD-10-CM | POA: Diagnosis not present

## 2020-06-06 DIAGNOSIS — N183 Chronic kidney disease, stage 3 unspecified: Secondary | ICD-10-CM | POA: Diagnosis not present

## 2020-06-06 DIAGNOSIS — F325 Major depressive disorder, single episode, in full remission: Secondary | ICD-10-CM | POA: Diagnosis not present

## 2020-06-06 DIAGNOSIS — F331 Major depressive disorder, recurrent, moderate: Secondary | ICD-10-CM | POA: Diagnosis not present

## 2020-06-06 DIAGNOSIS — E782 Mixed hyperlipidemia: Secondary | ICD-10-CM | POA: Diagnosis not present

## 2020-06-06 DIAGNOSIS — K219 Gastro-esophageal reflux disease without esophagitis: Secondary | ICD-10-CM | POA: Diagnosis not present

## 2020-06-06 DIAGNOSIS — I10 Essential (primary) hypertension: Secondary | ICD-10-CM | POA: Diagnosis not present

## 2020-06-26 DIAGNOSIS — K219 Gastro-esophageal reflux disease without esophagitis: Secondary | ICD-10-CM | POA: Diagnosis not present

## 2020-06-26 DIAGNOSIS — E782 Mixed hyperlipidemia: Secondary | ICD-10-CM | POA: Diagnosis not present

## 2020-06-26 DIAGNOSIS — N183 Chronic kidney disease, stage 3 unspecified: Secondary | ICD-10-CM | POA: Diagnosis not present

## 2020-06-26 DIAGNOSIS — F325 Major depressive disorder, single episode, in full remission: Secondary | ICD-10-CM | POA: Diagnosis not present

## 2020-06-26 DIAGNOSIS — I10 Essential (primary) hypertension: Secondary | ICD-10-CM | POA: Diagnosis not present

## 2020-06-26 DIAGNOSIS — F331 Major depressive disorder, recurrent, moderate: Secondary | ICD-10-CM | POA: Diagnosis not present

## 2020-07-25 DIAGNOSIS — N183 Chronic kidney disease, stage 3 unspecified: Secondary | ICD-10-CM | POA: Diagnosis not present

## 2020-07-25 DIAGNOSIS — F325 Major depressive disorder, single episode, in full remission: Secondary | ICD-10-CM | POA: Diagnosis not present

## 2020-07-25 DIAGNOSIS — K219 Gastro-esophageal reflux disease without esophagitis: Secondary | ICD-10-CM | POA: Diagnosis not present

## 2020-07-25 DIAGNOSIS — E782 Mixed hyperlipidemia: Secondary | ICD-10-CM | POA: Diagnosis not present

## 2020-07-25 DIAGNOSIS — I10 Essential (primary) hypertension: Secondary | ICD-10-CM | POA: Diagnosis not present

## 2020-08-28 DIAGNOSIS — N183 Chronic kidney disease, stage 3 unspecified: Secondary | ICD-10-CM | POA: Diagnosis not present

## 2020-08-28 DIAGNOSIS — F325 Major depressive disorder, single episode, in full remission: Secondary | ICD-10-CM | POA: Diagnosis not present

## 2020-08-28 DIAGNOSIS — K219 Gastro-esophageal reflux disease without esophagitis: Secondary | ICD-10-CM | POA: Diagnosis not present

## 2020-08-28 DIAGNOSIS — F331 Major depressive disorder, recurrent, moderate: Secondary | ICD-10-CM | POA: Diagnosis not present

## 2020-08-28 DIAGNOSIS — I10 Essential (primary) hypertension: Secondary | ICD-10-CM | POA: Diagnosis not present

## 2020-08-28 DIAGNOSIS — E782 Mixed hyperlipidemia: Secondary | ICD-10-CM | POA: Diagnosis not present

## 2020-09-07 DIAGNOSIS — F419 Anxiety disorder, unspecified: Secondary | ICD-10-CM | POA: Diagnosis not present

## 2020-09-07 DIAGNOSIS — F332 Major depressive disorder, recurrent severe without psychotic features: Secondary | ICD-10-CM | POA: Diagnosis not present

## 2020-10-20 DIAGNOSIS — K219 Gastro-esophageal reflux disease without esophagitis: Secondary | ICD-10-CM | POA: Diagnosis not present

## 2020-10-20 DIAGNOSIS — I10 Essential (primary) hypertension: Secondary | ICD-10-CM | POA: Diagnosis not present

## 2020-10-20 DIAGNOSIS — F325 Major depressive disorder, single episode, in full remission: Secondary | ICD-10-CM | POA: Diagnosis not present

## 2020-10-20 DIAGNOSIS — N183 Chronic kidney disease, stage 3 unspecified: Secondary | ICD-10-CM | POA: Diagnosis not present

## 2020-10-20 DIAGNOSIS — E782 Mixed hyperlipidemia: Secondary | ICD-10-CM | POA: Diagnosis not present

## 2020-10-20 DIAGNOSIS — M8580 Other specified disorders of bone density and structure, unspecified site: Secondary | ICD-10-CM | POA: Diagnosis not present

## 2020-10-23 DIAGNOSIS — E782 Mixed hyperlipidemia: Secondary | ICD-10-CM | POA: Diagnosis not present

## 2020-10-23 DIAGNOSIS — I10 Essential (primary) hypertension: Secondary | ICD-10-CM | POA: Diagnosis not present

## 2020-10-23 DIAGNOSIS — F419 Anxiety disorder, unspecified: Secondary | ICD-10-CM | POA: Diagnosis not present

## 2020-10-23 DIAGNOSIS — M5090 Cervical disc disorder, unspecified, unspecified cervical region: Secondary | ICD-10-CM | POA: Diagnosis not present

## 2020-10-23 DIAGNOSIS — Z1389 Encounter for screening for other disorder: Secondary | ICD-10-CM | POA: Diagnosis not present

## 2020-10-23 DIAGNOSIS — N183 Chronic kidney disease, stage 3 unspecified: Secondary | ICD-10-CM | POA: Diagnosis not present

## 2020-10-23 DIAGNOSIS — R519 Headache, unspecified: Secondary | ICD-10-CM | POA: Diagnosis not present

## 2020-10-23 DIAGNOSIS — K219 Gastro-esophageal reflux disease without esophagitis: Secondary | ICD-10-CM | POA: Diagnosis not present

## 2020-10-23 DIAGNOSIS — M8580 Other specified disorders of bone density and structure, unspecified site: Secondary | ICD-10-CM | POA: Diagnosis not present

## 2020-10-23 DIAGNOSIS — F331 Major depressive disorder, recurrent, moderate: Secondary | ICD-10-CM | POA: Diagnosis not present

## 2020-10-23 DIAGNOSIS — Z0000 Encounter for general adult medical examination without abnormal findings: Secondary | ICD-10-CM | POA: Diagnosis not present

## 2020-11-01 DIAGNOSIS — Z6823 Body mass index (BMI) 23.0-23.9, adult: Secondary | ICD-10-CM | POA: Diagnosis not present

## 2020-11-01 DIAGNOSIS — Z124 Encounter for screening for malignant neoplasm of cervix: Secondary | ICD-10-CM | POA: Diagnosis not present

## 2020-11-01 DIAGNOSIS — Z1231 Encounter for screening mammogram for malignant neoplasm of breast: Secondary | ICD-10-CM | POA: Diagnosis not present

## 2020-11-23 DIAGNOSIS — F419 Anxiety disorder, unspecified: Secondary | ICD-10-CM | POA: Diagnosis not present

## 2020-11-23 DIAGNOSIS — F332 Major depressive disorder, recurrent severe without psychotic features: Secondary | ICD-10-CM | POA: Diagnosis not present

## 2020-12-20 DIAGNOSIS — E782 Mixed hyperlipidemia: Secondary | ICD-10-CM | POA: Diagnosis not present

## 2020-12-20 DIAGNOSIS — N183 Chronic kidney disease, stage 3 unspecified: Secondary | ICD-10-CM | POA: Diagnosis not present

## 2020-12-20 DIAGNOSIS — F331 Major depressive disorder, recurrent, moderate: Secondary | ICD-10-CM | POA: Diagnosis not present

## 2020-12-20 DIAGNOSIS — M8580 Other specified disorders of bone density and structure, unspecified site: Secondary | ICD-10-CM | POA: Diagnosis not present

## 2020-12-20 DIAGNOSIS — I10 Essential (primary) hypertension: Secondary | ICD-10-CM | POA: Diagnosis not present

## 2020-12-20 DIAGNOSIS — F325 Major depressive disorder, single episode, in full remission: Secondary | ICD-10-CM | POA: Diagnosis not present

## 2020-12-20 DIAGNOSIS — K219 Gastro-esophageal reflux disease without esophagitis: Secondary | ICD-10-CM | POA: Diagnosis not present

## 2021-02-03 IMAGING — CT CT ANKLE*R* W/O CM
3 series · 12 of 33 positions shown, 14 images · non-contrast
Comparison: Radiographs dated 04/13/2019

CLINICAL DATA: Fracture dislocation of the right ankle. Status post
reduction.

EXAM:
CT OF THE RIGHT ANKLE WITHOUT CONTRAST
TECHNIQUE: Multidetector CT imaging of the right ankle was performed according
to the standard protocol. Multiplanar CT image reconstructions were
also generated.

[Series 5: lfov ext 3.0 b40s · axial · 0.37mm/px · z∈[+96,+228]mm · 4 of 64 slices shown, 5 images]
[im 10/64  soft-tissue]
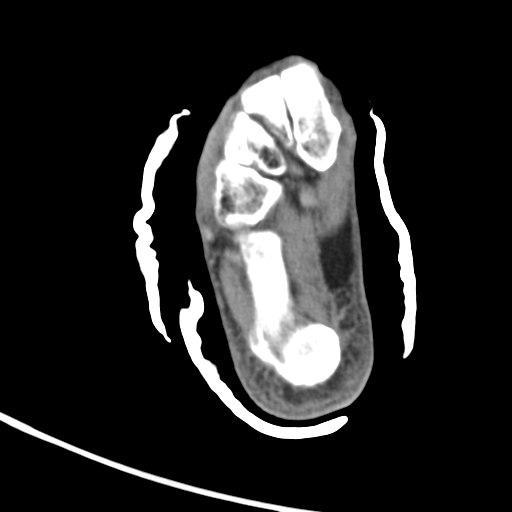
[im 10/64  bone]
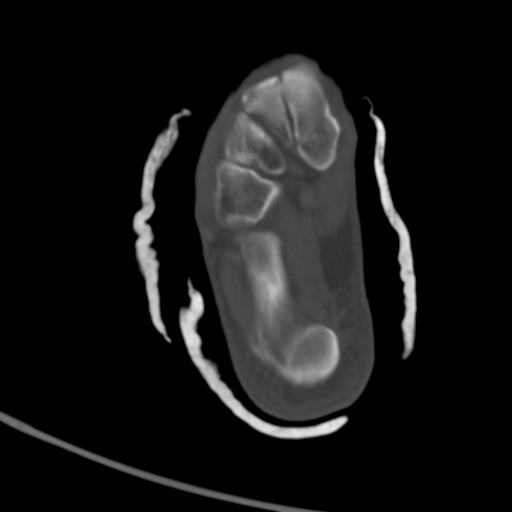
[im 25/64  bone]
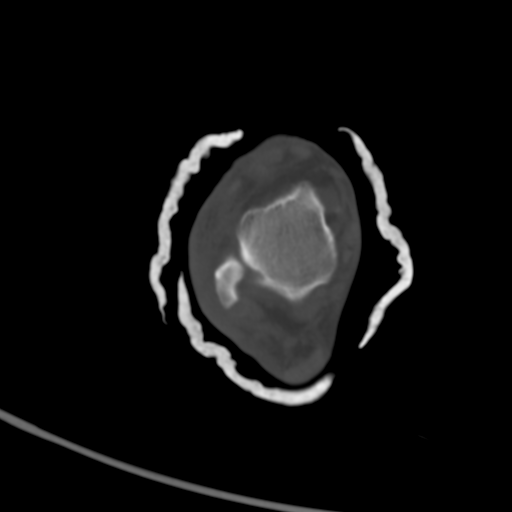
[im 39/64  bone]
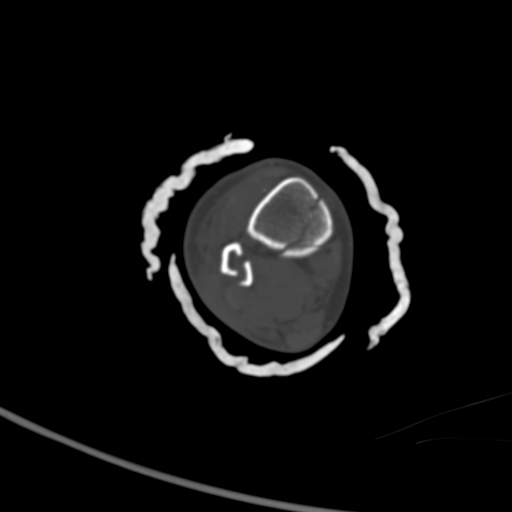
[im 54/64  bone]
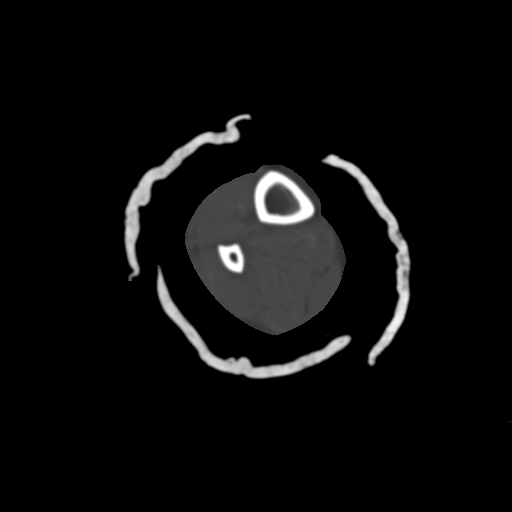

[Series 8: coronalsoft tissue · coronal · 0.27mm/px · 3 of 63 slices shown]
[im 13/63  bone]
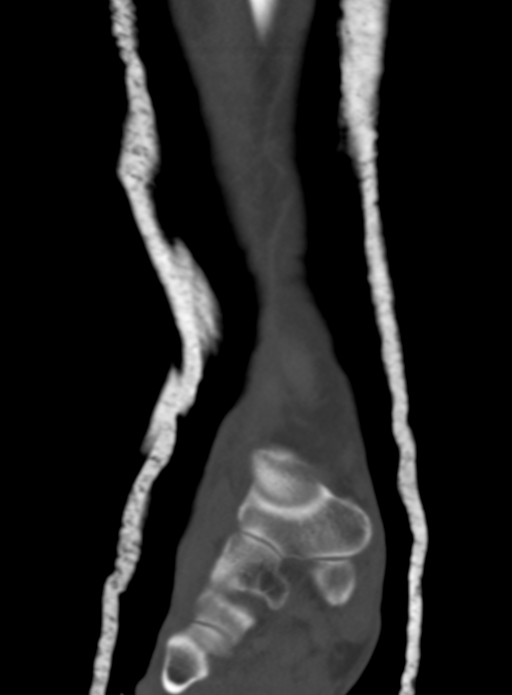
[im 25/63  bone]
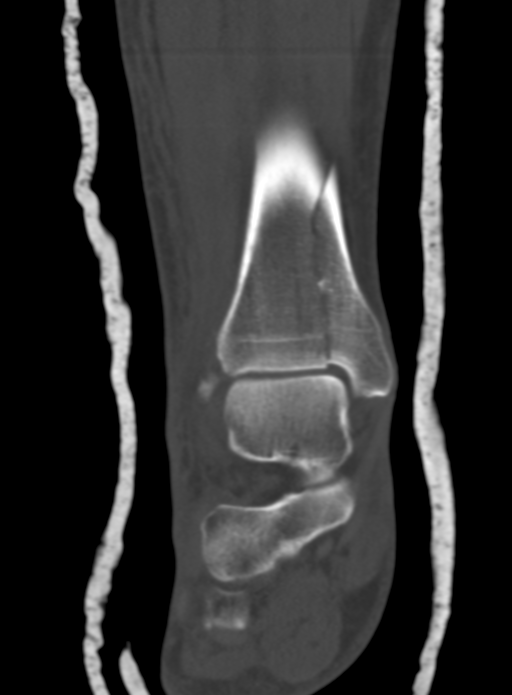
[im 38/63  bone]
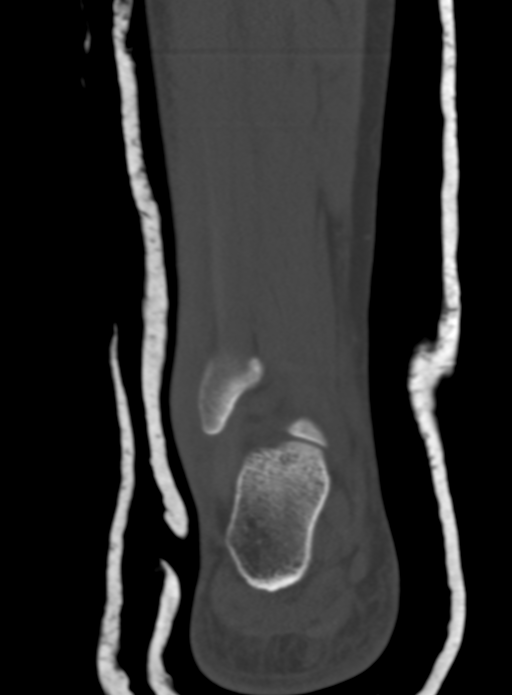

[Series 9: sagittalsoft tissue · sagittal · 0.25mm/px · 5 of 54 slices shown, 6 images]
[im 18/54  bone]
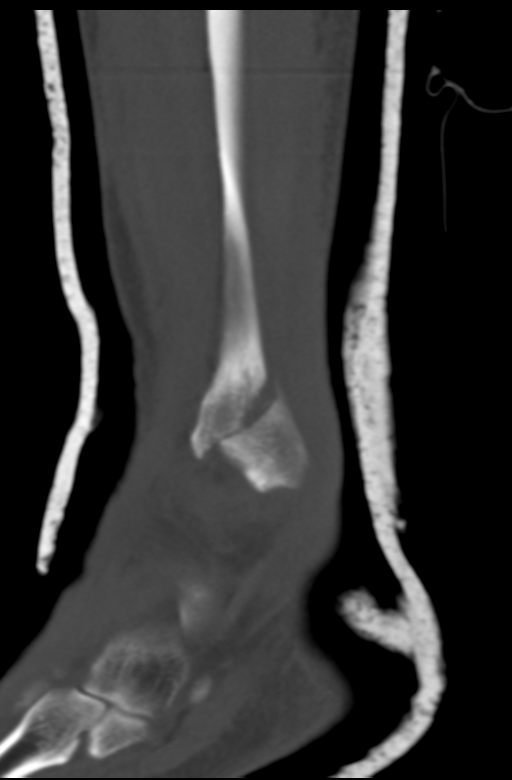
[im 23/54  bone]
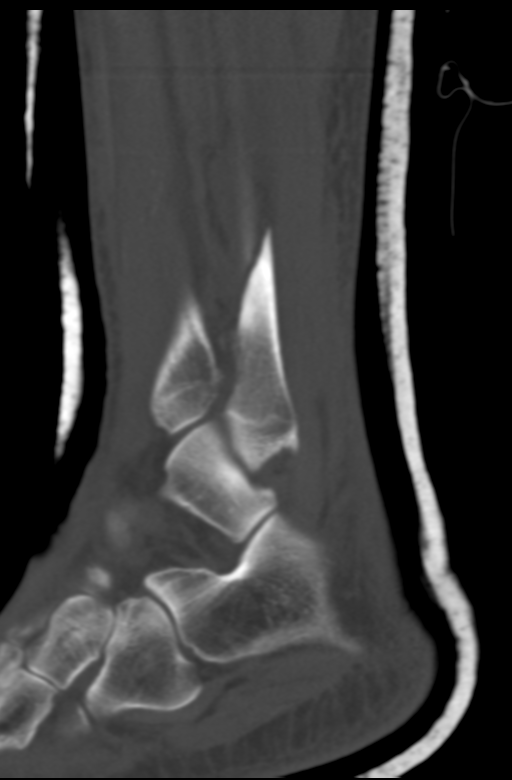
[im 27/54  soft-tissue]
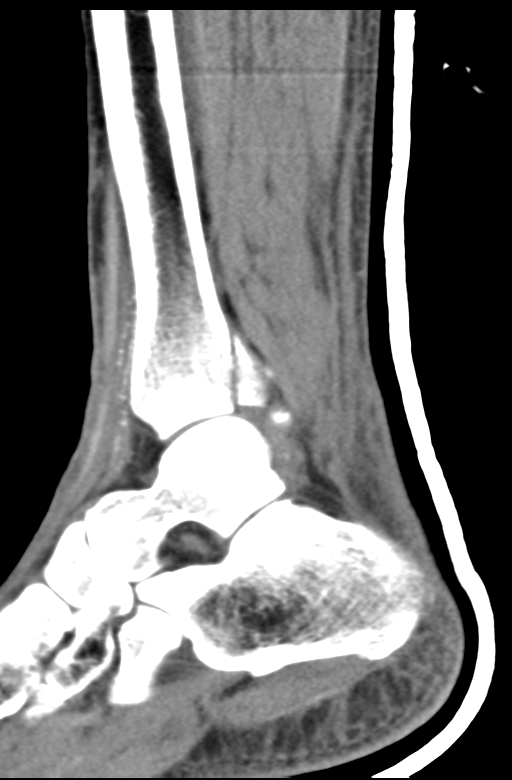
[im 27/54  bone]
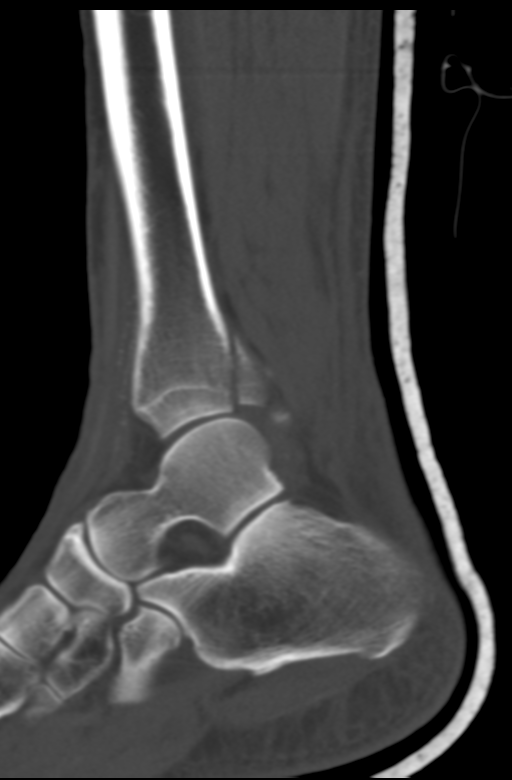
[im 31/54  bone]
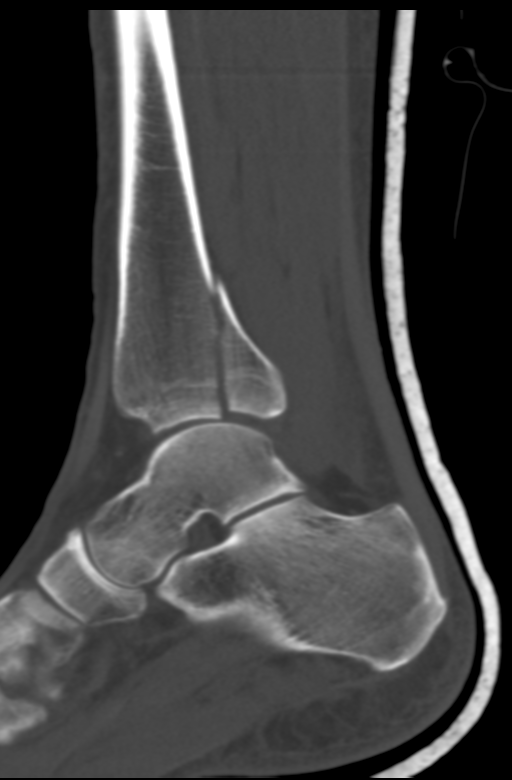
[im 36/54  bone]
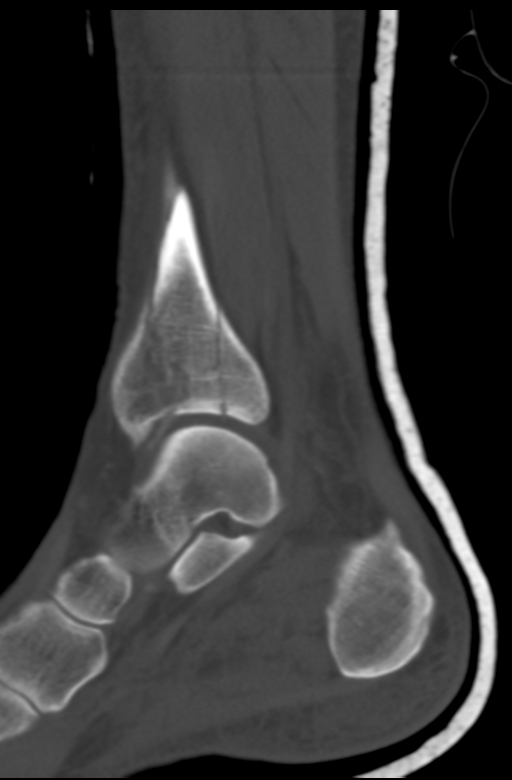

[12 of 33 positions shown; findings below may reference images not displayed]

FINDINGS: Bones/Joint/Cartilage

There is a trimalleolar fracture. The dislocation has been reduced.
The fracture through the medial malleolus is primarily sagittal,
extending 6 cm proximal to the tibiotalar articulation. There is
also a nondisplaced horizontal fracture through the anterior aspect
medial aspect of the distal tibia, best seen on image 21 of series 6
and on image 32 of series 4. There is a minimally distracted
fracture of the posterior malleolus involving approximately [DATE] of
the articular surface of the distal tibia.

There is a 2 mm step-off in the articular surface of the distal
tibia best seen on image 27 of series 6.

There is a oblique coronal spiral fracture of the distal fibular
shaft with minimal distraction. The fracture does extend into the
distal tibiofibular articulation.

Ligaments

The ligaments are not well enough seen for assessment.

Muscles and Tendons

No tendon entrapment.

Soft tissues

Soft tissue swelling anteriorly and laterally in the subcutaneous
fat.
IMPRESSION: Trimalleolar fracture as described.  Reduction of the dislocation.

## 2021-02-14 DIAGNOSIS — F332 Major depressive disorder, recurrent severe without psychotic features: Secondary | ICD-10-CM | POA: Diagnosis not present

## 2021-02-14 DIAGNOSIS — F419 Anxiety disorder, unspecified: Secondary | ICD-10-CM | POA: Diagnosis not present

## 2021-02-17 IMAGING — RF DG C-ARM 1-60 MIN
1 series · 8 of 8 positions shown · IV contrast (agent unspecified)
Comparison: None.

CLINICAL DATA: Open reduction and internal fixation of the right
ankle.

EXAM:
DG C-ARM 1-60 MIN
CONTRAST:  None
FLUOROSCOPY TIME:  Fluoroscopy Time:  22.3 seconds
Radiation Exposure Index (if provided by the fluoroscopic device):
N/A
Number of Acquired Spot Images: 8

[Series 1: unknown protocol · 0.14mm/px · 8 of 8 slices shown]
[im 1/8]
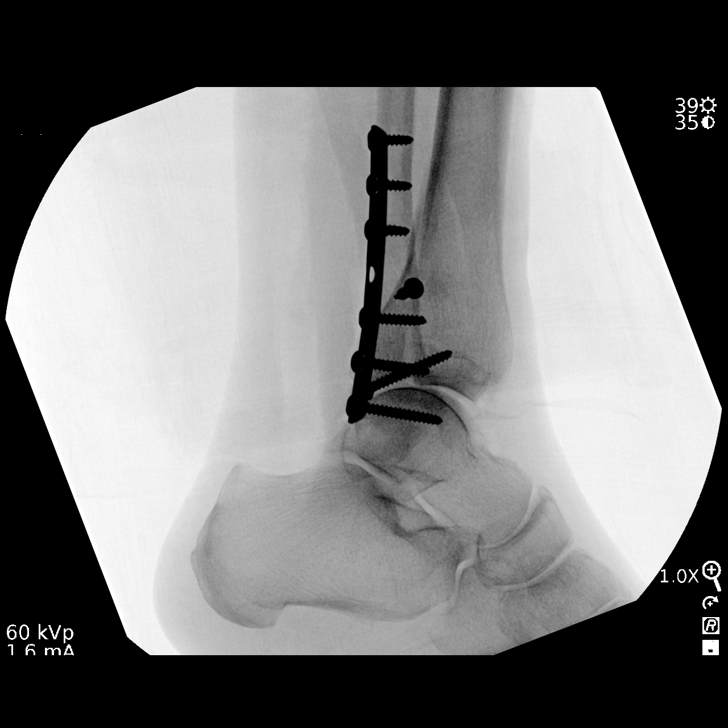
[im 2/8]
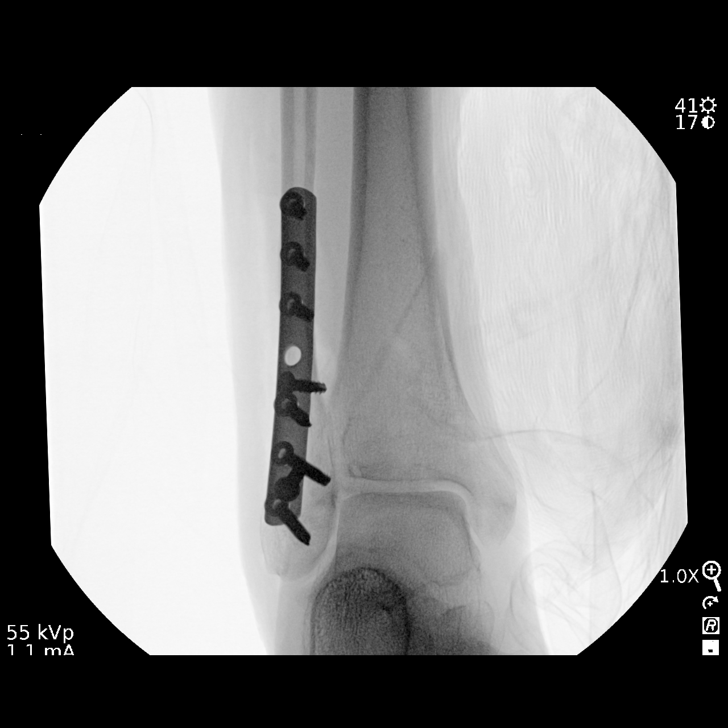
[im 3/8]
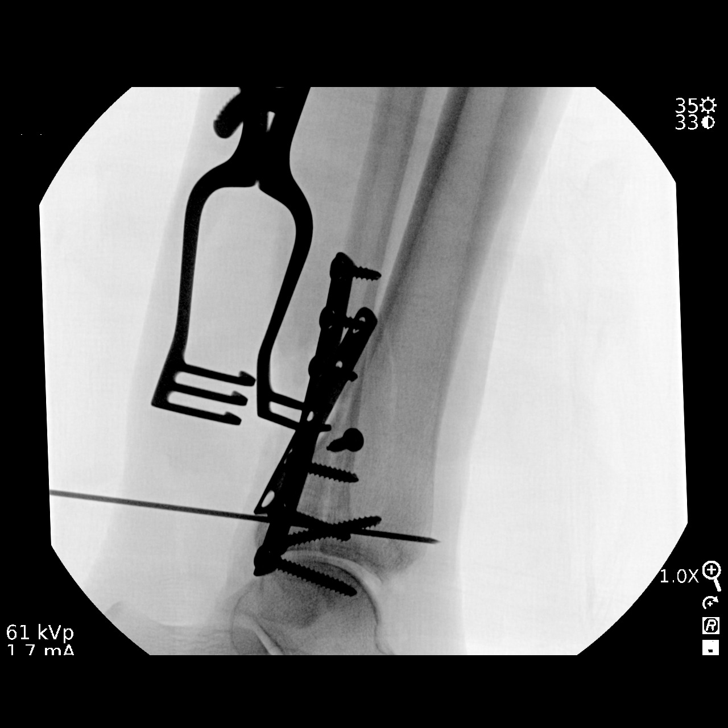
[im 4/8]
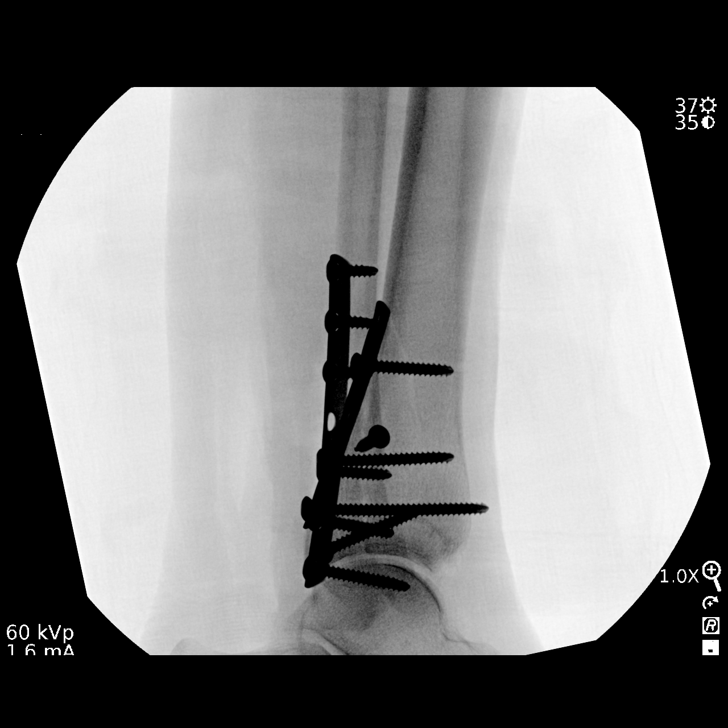
[im 5/8]
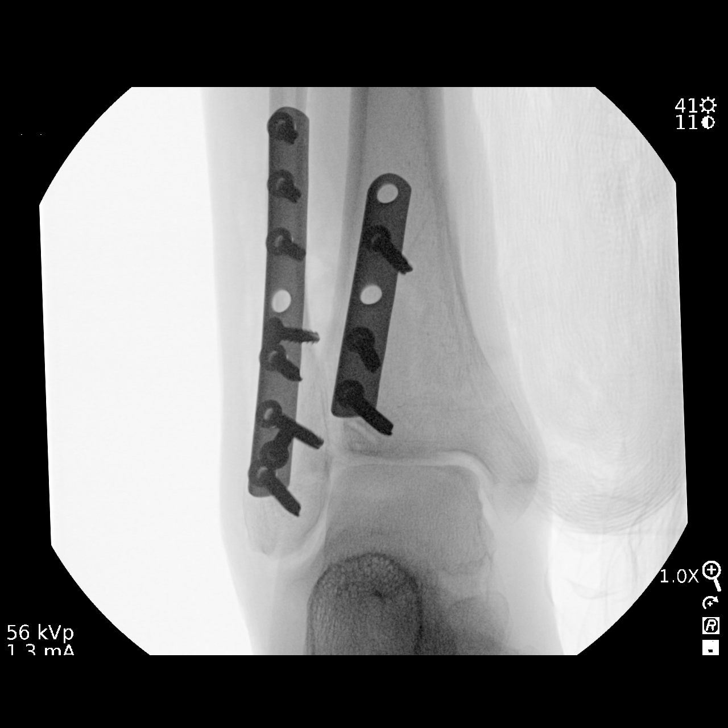
[im 6/8]
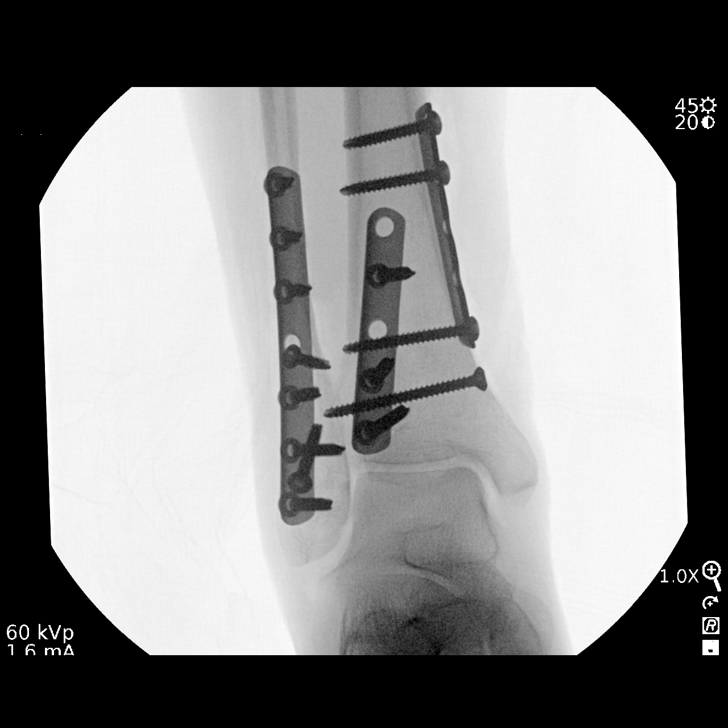
[im 7/8]
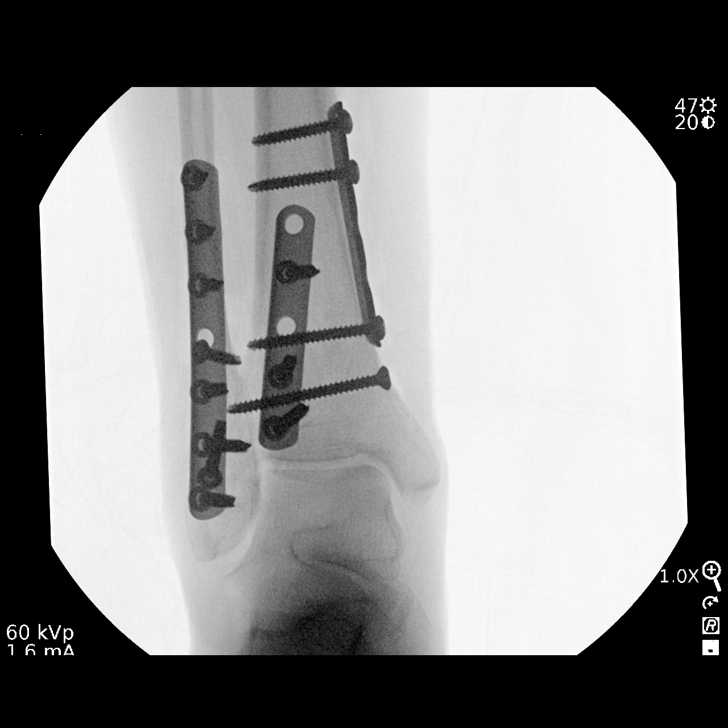
[im 8/8]
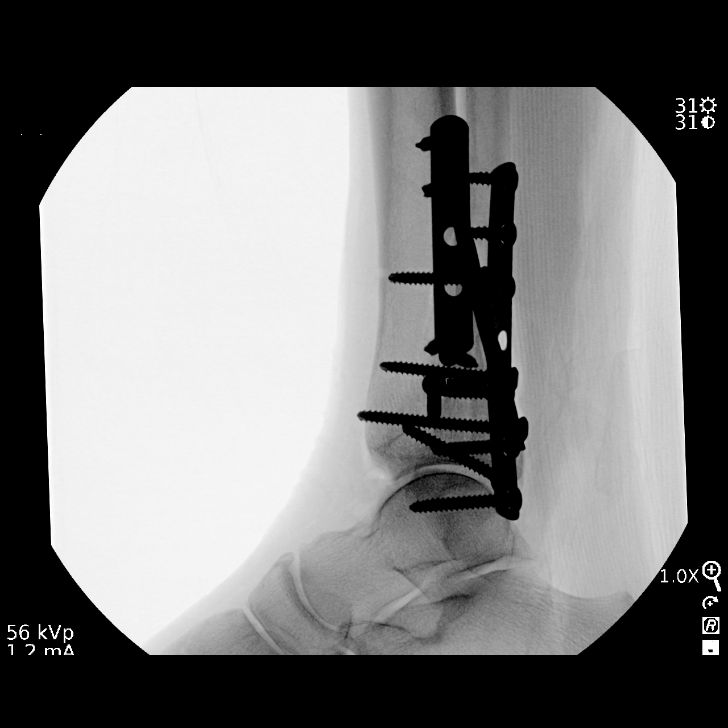

[8 of 8 positions shown; findings below may reference images not displayed]

FINDINGS: Acute fracture deformities are seen involving the right lateral,
posterior and medial malleoli.

A long radiopaque fixation plate and multiple fixation screws are
placed along the right lateral malleolus with two additional
radiopaque fixation plates and screws placed along the distal right
tibia.

Alignment of the fracture sites is seen.

The right ankle mortise is intact.

There is no evidence of arthropathy or other focal bone abnormality.

Mild diffuse soft tissue swelling is noted.
IMPRESSION: Status post open reduction and internal fixation of right ankle
fractures.

## 2021-02-19 DIAGNOSIS — N183 Chronic kidney disease, stage 3 unspecified: Secondary | ICD-10-CM | POA: Diagnosis not present

## 2021-02-19 DIAGNOSIS — I10 Essential (primary) hypertension: Secondary | ICD-10-CM | POA: Diagnosis not present

## 2021-02-19 DIAGNOSIS — F331 Major depressive disorder, recurrent, moderate: Secondary | ICD-10-CM | POA: Diagnosis not present

## 2021-02-19 DIAGNOSIS — K219 Gastro-esophageal reflux disease without esophagitis: Secondary | ICD-10-CM | POA: Diagnosis not present

## 2021-02-19 DIAGNOSIS — E782 Mixed hyperlipidemia: Secondary | ICD-10-CM | POA: Diagnosis not present

## 2021-02-19 DIAGNOSIS — M8580 Other specified disorders of bone density and structure, unspecified site: Secondary | ICD-10-CM | POA: Diagnosis not present

## 2021-03-21 DIAGNOSIS — E782 Mixed hyperlipidemia: Secondary | ICD-10-CM | POA: Diagnosis not present

## 2021-03-21 DIAGNOSIS — I10 Essential (primary) hypertension: Secondary | ICD-10-CM | POA: Diagnosis not present

## 2021-05-02 DIAGNOSIS — F332 Major depressive disorder, recurrent severe without psychotic features: Secondary | ICD-10-CM | POA: Diagnosis not present

## 2021-05-02 DIAGNOSIS — F419 Anxiety disorder, unspecified: Secondary | ICD-10-CM | POA: Diagnosis not present

## 2021-08-07 DIAGNOSIS — F332 Major depressive disorder, recurrent severe without psychotic features: Secondary | ICD-10-CM | POA: Diagnosis not present

## 2021-08-07 DIAGNOSIS — F419 Anxiety disorder, unspecified: Secondary | ICD-10-CM | POA: Diagnosis not present

## 2021-10-26 DIAGNOSIS — F419 Anxiety disorder, unspecified: Secondary | ICD-10-CM | POA: Diagnosis not present

## 2021-10-26 DIAGNOSIS — I10 Essential (primary) hypertension: Secondary | ICD-10-CM | POA: Diagnosis not present

## 2021-10-26 DIAGNOSIS — Z1331 Encounter for screening for depression: Secondary | ICD-10-CM | POA: Diagnosis not present

## 2021-10-26 DIAGNOSIS — R519 Headache, unspecified: Secondary | ICD-10-CM | POA: Diagnosis not present

## 2021-10-26 DIAGNOSIS — L989 Disorder of the skin and subcutaneous tissue, unspecified: Secondary | ICD-10-CM | POA: Diagnosis not present

## 2021-10-26 DIAGNOSIS — M8580 Other specified disorders of bone density and structure, unspecified site: Secondary | ICD-10-CM | POA: Diagnosis not present

## 2021-10-26 DIAGNOSIS — N1831 Chronic kidney disease, stage 3a: Secondary | ICD-10-CM | POA: Diagnosis not present

## 2021-10-26 DIAGNOSIS — K219 Gastro-esophageal reflux disease without esophagitis: Secondary | ICD-10-CM | POA: Diagnosis not present

## 2021-10-26 DIAGNOSIS — Z0000 Encounter for general adult medical examination without abnormal findings: Secondary | ICD-10-CM | POA: Diagnosis not present

## 2021-10-26 DIAGNOSIS — E782 Mixed hyperlipidemia: Secondary | ICD-10-CM | POA: Diagnosis not present

## 2021-10-26 DIAGNOSIS — F331 Major depressive disorder, recurrent, moderate: Secondary | ICD-10-CM | POA: Diagnosis not present

## 2021-11-08 DIAGNOSIS — F419 Anxiety disorder, unspecified: Secondary | ICD-10-CM | POA: Diagnosis not present

## 2021-11-08 DIAGNOSIS — F332 Major depressive disorder, recurrent severe without psychotic features: Secondary | ICD-10-CM | POA: Diagnosis not present

## 2021-11-09 DIAGNOSIS — Z6823 Body mass index (BMI) 23.0-23.9, adult: Secondary | ICD-10-CM | POA: Diagnosis not present

## 2021-11-09 DIAGNOSIS — Z1231 Encounter for screening mammogram for malignant neoplasm of breast: Secondary | ICD-10-CM | POA: Diagnosis not present

## 2021-11-09 DIAGNOSIS — Z01419 Encounter for gynecological examination (general) (routine) without abnormal findings: Secondary | ICD-10-CM | POA: Diagnosis not present

## 2021-11-28 DIAGNOSIS — C44311 Basal cell carcinoma of skin of nose: Secondary | ICD-10-CM | POA: Diagnosis not present

## 2021-12-31 DIAGNOSIS — C44311 Basal cell carcinoma of skin of nose: Secondary | ICD-10-CM | POA: Diagnosis not present

## 2022-01-30 DIAGNOSIS — F419 Anxiety disorder, unspecified: Secondary | ICD-10-CM | POA: Diagnosis not present

## 2022-01-30 DIAGNOSIS — F332 Major depressive disorder, recurrent severe without psychotic features: Secondary | ICD-10-CM | POA: Diagnosis not present

## 2022-02-25 DIAGNOSIS — C44311 Basal cell carcinoma of skin of nose: Secondary | ICD-10-CM | POA: Diagnosis not present

## 2022-04-16 DIAGNOSIS — F419 Anxiety disorder, unspecified: Secondary | ICD-10-CM | POA: Diagnosis not present

## 2022-04-16 DIAGNOSIS — F332 Major depressive disorder, recurrent severe without psychotic features: Secondary | ICD-10-CM | POA: Diagnosis not present

## 2022-05-02 DIAGNOSIS — M5090 Cervical disc disorder, unspecified, unspecified cervical region: Secondary | ICD-10-CM | POA: Diagnosis not present

## 2022-05-02 DIAGNOSIS — I10 Essential (primary) hypertension: Secondary | ICD-10-CM | POA: Diagnosis not present

## 2022-05-02 DIAGNOSIS — F331 Major depressive disorder, recurrent, moderate: Secondary | ICD-10-CM | POA: Diagnosis not present

## 2022-05-02 DIAGNOSIS — N1831 Chronic kidney disease, stage 3a: Secondary | ICD-10-CM | POA: Diagnosis not present

## 2022-05-02 DIAGNOSIS — R519 Headache, unspecified: Secondary | ICD-10-CM | POA: Diagnosis not present

## 2022-07-17 DIAGNOSIS — F332 Major depressive disorder, recurrent severe without psychotic features: Secondary | ICD-10-CM | POA: Diagnosis not present

## 2022-07-17 DIAGNOSIS — F419 Anxiety disorder, unspecified: Secondary | ICD-10-CM | POA: Diagnosis not present

## 2022-08-01 DIAGNOSIS — R194 Change in bowel habit: Secondary | ICD-10-CM | POA: Diagnosis not present

## 2022-10-02 ENCOUNTER — Encounter: Payer: Self-pay | Admitting: Nurse Practitioner

## 2022-10-24 DIAGNOSIS — F419 Anxiety disorder, unspecified: Secondary | ICD-10-CM | POA: Diagnosis not present

## 2022-10-24 DIAGNOSIS — F332 Major depressive disorder, recurrent severe without psychotic features: Secondary | ICD-10-CM | POA: Diagnosis not present

## 2022-10-30 DIAGNOSIS — Z1331 Encounter for screening for depression: Secondary | ICD-10-CM | POA: Diagnosis not present

## 2022-10-30 DIAGNOSIS — Z0000 Encounter for general adult medical examination without abnormal findings: Secondary | ICD-10-CM | POA: Diagnosis not present

## 2022-10-30 DIAGNOSIS — E782 Mixed hyperlipidemia: Secondary | ICD-10-CM | POA: Diagnosis not present

## 2022-10-30 DIAGNOSIS — M8580 Other specified disorders of bone density and structure, unspecified site: Secondary | ICD-10-CM | POA: Diagnosis not present

## 2022-10-30 DIAGNOSIS — K219 Gastro-esophageal reflux disease without esophagitis: Secondary | ICD-10-CM | POA: Diagnosis not present

## 2022-10-30 DIAGNOSIS — I10 Essential (primary) hypertension: Secondary | ICD-10-CM | POA: Diagnosis not present

## 2022-10-30 DIAGNOSIS — N1831 Chronic kidney disease, stage 3a: Secondary | ICD-10-CM | POA: Diagnosis not present

## 2022-10-30 DIAGNOSIS — F331 Major depressive disorder, recurrent, moderate: Secondary | ICD-10-CM | POA: Diagnosis not present

## 2022-10-30 DIAGNOSIS — F419 Anxiety disorder, unspecified: Secondary | ICD-10-CM | POA: Diagnosis not present

## 2022-12-13 NOTE — Progress Notes (Addendum)
 12/13/2022 [name] [id number] 10/27/1950   CHIEF COMPLAINT: Mucous in stool  HISTORY OF PRESENT ILLNESS: [name] is a 72 year old female with a past medical history of anxiety, depression, hypertension, hyperlipidemia, CKD, osteopenia and GERD.  She presents to our office today as referred by Dr. Georgann Housekeeper for further evaluation regarding change in bowel pattern possible mucus in stool. She developed constipation with associated nonbloody mucous per the rectum June through then end of July 2024 which abated after she started taking Metamucil as recommended by Dr. Donette Larry.  She denies having any abdominal or anal rectal pain. Her most recent colonoscopy was 04/05/2016 which identified one 5mm sessile serrated polyp removed from transverse colon. She was advised to repeat a colonoscopy in 5 years which has not been done.  No known family history of colorectal cancer.  She denies having any reflux symptoms on Omeprazole 20 mg daily.  Labs 10/30/2022: TSH 1.48.  Glucose 86.  BUN 12.  Creatinine 1.07.  Sodium 141.  Potassium 4.4.  Calcium 9.4.  Albumin 4.2.  Total bili 0.5.  Alk phos 96.  AST 66.  ALT 44.  WBC 5.9.  Hemoglobin 12.6.  Hematocrit 37.7.  MCV 96.2.  Platelet 279.   PASTS GI PROCEDURES:   Colonoscopy 04/05/2016 by Dr. Russella Dar: - One 5 mm polyp in the transverse colon, removed with a cold biopsy forceps. Resected and retrieved.  - Internal hemorrhoids.  - The examination was otherwise normal on direct and retroflexion views. -5-year recall colonoscopy - SESSILE SERRATED ADENOMA.  Colonoscopy 02/13/2011: 5 mm sessile polyp in the cecum 8 mm sessile polyp in the distal transverse colon Surgical [P], cecum, transverse, polyps - SESSILE, SERRATED ADENOMA (X1). - HYPERPLASTIC POLYP (X1). - NO HIGH GRADE DYSPLASIA OR MALIGNANCY IDENTIFIED.   Past Medical History:  Diagnosis Date   Abdominal hernia    Anxiety    Chronic back pain    Closed right ankle fracture     Depression    GERD (gastroesophageal reflux disease)    Headache    migraine   Hypertension    Past Surgical History:  Procedure Laterality Date   CESAREAN SECTION  1983, 1985   x2   COLONOSCOPY     ORIF ANKLE FRACTURE Right 04/27/2019   Procedure: OPEN REDUCTION INTERNAL FIXATION (ORIF) ANKLE FRACTURE;  Surgeon: Yolonda Kida, MD;  Location: [facility] OR;  Service: Orthopedics;  Laterality: Right;  2 hrs RNFA   WISDOM TOOTH EXTRACTION     Social History: She is married.  She has 2 sons.  She is retired.  Nonsmoker. No alcohol use. No drug use.   Family History: Father with history of breast cancer. Mother with heart disease.  No known family history of IBD or colorectal cancer.  Allergies  Allergen Reactions   Elemental Sulfur Nausea And Vomiting and Rash      Outpatient Encounter Medications as of 12/16/2022  Medication Sig   amLODipine (NORVASC) 5 MG tablet Take 5 mg by mouth daily.    buPROPion (WELLBUTRIN XL) 300 MG 24 hr tablet Take 300 mg by mouth daily after breakfast.    Calcium Carb-Cholecalciferol (CALCIUM + D3 PO) Take 1 tablet by mouth every evening.   FLUoxetine (PROZAC) 40 MG capsule Take 80 mg by mouth daily after breakfast.    Multiple Vitamin (MULTIVITAMIN WITH MINERALS) TABS tablet Take 1 tablet by mouth every evening.   omeprazole (PRILOSEC) 20 MG capsule Take 20 mg by mouth daily before breakfast.  ondansetron (ZOFRAN ODT) 4 MG disintegrating tablet Take 1 tablet (4 mg total) by mouth every 8 (eight) hours as needed for nausea or vomiting.   rizatriptan (MAXALT) 10 MG tablet Take 1 tablet (10 mg total) by mouth daily as needed. (Patient taking differently: Take 10 mg by mouth every 2 (two) hours as needed for migraine. )   topiramate (TOPAMAX) 100 MG tablet Take 1 tablet (100 mg total) by mouth at bedtime.   Facility-Administered Encounter Medications as of 12/16/2022  Medication   0.9 %  sodium chloride infusion    REVIEW OF SYSTEMS:  Gen: Denies  fever, sweats or chills. No weight loss.  CV: Denies chest pain, palpitations or edema. Resp: + SOB.  No cough or hemoptysis. GI: See HPI. GU: Denies urinary burning, blood in urine, increased urinary frequency or incontinence. MS: + Arthritis and back pain.  Derm: Denies rash, itchiness, skin lesions or unhealing ulcers. Psych: Denies depression, anxiety, memory loss or confusion. Heme: Denies bruising, easy bleeding. Neuro:  + Headaches.  Endo:  Denies any problems with DM, thyroid or adrenal function.  PHYSICAL EXAM: Ht 5\' 5"  (1.651 m)   Wt 134 lb 6 oz (61 kg)   BMI 22.36 kg/m   General: 72 year old female in no acute distress. Head: Normocephalic and atraumatic. Eyes:  Sclerae non-icteric, conjunctive pink. Ears: Normal auditory acuity. Mouth: Dentition intact. No ulcers or lesions.  Neck: Supple, no lymphadenopathy or thyromegaly.  Lungs: Clear bilaterally to auscultation without wheezes, crackles or rhonchi. Heart: Regular rate and rhythm. Soft murmur. No rub or gallop appreciated.  Abdomen: Soft, nontender, nondistended. No masses. No hepatosplenomegaly. Normoactive bowel sounds x 4 quadrants.  Rectal: Deferred.  Musculoskeletal: Symmetrical with no gross deformities. Skin: Warm and dry. No rash or lesions on visible extremities. Extremities: No edema. Neurological: Alert oriented x 4, no focal deficits.  Psychological:  Alert and cooperative. Normal mood and affect.  ASSESSMENT AND PLAN:  Constipation with associated nonbloody mucous with bowel movements June - July 2024 which abated after taking Metamucil every day.  I informed that the patient that the colon produces more secretions and mucus in the setting of constipation. -Continue Metamucil daily -MiraLAX nightly -Dietary fiber as tolerated -Drink 8 glasses water daily  History of colon polyps. Her most recent colonoscopy was 04/05/2016 identified one 5mm sessile serrated polyp removed from transverse colon.   -Colonoscopy benefits and risks discussed including risk with sedation, risk of bleeding, perforation and infection.  To schedule a colonoscopy after patient evaluated by cardiology, see below  Soft heart murmur on exam. No prior history of a heart murmur. No CP. Occasional SOB.  -Patient referred to cardiology to further evaluate soft systolic heart murmur on exam, consider ECHO. Await cardiology input/cardiac clearance prior to scheduling a colonoscopy.   Hypertension, well controlled   Mildly elevated AST level.  AST 66.  Normal ALT 44.  Alk phos 96.  Total bili 0.5. -Recommend repeat hepatic panel in a few months with PCP   ADDENDUM: Patient was seen by cardiologist Dr. Armanda Magic on 03/24/2023 as requested. 1/6 systolic murmur confirmed. Patient also noted having left chest pain/heaviness a few weeks prior to being seen by Dr. Mayford Knife. ECHO and coronary CTA ordered. Endoscopic evaluation deferred until cardiac clearance received.     CC:  Georgann Housekeeper, MD

## 2022-12-16 ENCOUNTER — Ambulatory Visit: Payer: PPO | Admitting: Nurse Practitioner

## 2022-12-16 ENCOUNTER — Encounter: Payer: Self-pay | Admitting: Nurse Practitioner

## 2022-12-16 VITALS — BP 130/64 | HR 86 | Ht 65.0 in | Wt 134.4 lb

## 2022-12-16 DIAGNOSIS — Z860100 Personal history of colon polyps, unspecified: Secondary | ICD-10-CM

## 2022-12-16 DIAGNOSIS — R011 Cardiac murmur, unspecified: Secondary | ICD-10-CM

## 2022-12-16 DIAGNOSIS — K5900 Constipation, unspecified: Secondary | ICD-10-CM | POA: Diagnosis not present

## 2022-12-16 NOTE — Patient Instructions (Addendum)
Cardiac clearance required prior to scheduling a colonoscopy due to a new heart murmur on exam today  Benefiber 1 tablespoon daily  Take Miralax 1 capful mixed in 8 ounces of water at bed time for constipation as tolerated.  Due to recent changes in healthcare laws, you may see the results of your imaging and laboratory studies on MyChart before your provider has had a chance to review them.  We understand that in some cases there may be results that are confusing or concerning to you. Not all laboratory results come back in the same time frame and the provider may be waiting for multiple results in order to interpret others.  Please give [place] 48 hours in order for your provider to thoroughly review all the results before contacting the office for clarification of your results.   Thank you for trusting me with your gastrointestinal care!   Alcide Evener, CRNP

## 2023-01-13 DIAGNOSIS — Z124 Encounter for screening for malignant neoplasm of cervix: Secondary | ICD-10-CM | POA: Diagnosis not present

## 2023-01-13 DIAGNOSIS — Z6823 Body mass index (BMI) 23.0-23.9, adult: Secondary | ICD-10-CM | POA: Diagnosis not present

## 2023-01-13 DIAGNOSIS — Z1231 Encounter for screening mammogram for malignant neoplasm of breast: Secondary | ICD-10-CM | POA: Diagnosis not present

## 2023-03-24 ENCOUNTER — Encounter: Payer: Self-pay | Admitting: Cardiology

## 2023-03-24 ENCOUNTER — Ambulatory Visit: Payer: PPO | Attending: Cardiology | Admitting: Cardiology

## 2023-03-24 VITALS — BP 142/78 | HR 88 | Ht 65.0 in | Wt 140.4 lb

## 2023-03-24 DIAGNOSIS — R011 Cardiac murmur, unspecified: Secondary | ICD-10-CM

## 2023-03-24 DIAGNOSIS — R072 Precordial pain: Secondary | ICD-10-CM | POA: Diagnosis not present

## 2023-03-24 DIAGNOSIS — I10 Essential (primary) hypertension: Secondary | ICD-10-CM | POA: Diagnosis not present

## 2023-03-24 MED ORDER — METOPROLOL TARTRATE 100 MG PO TABS: 100.0000 mg | ORAL_TABLET | ORAL | 0 refills | Status: DC

## 2023-03-24 NOTE — Progress Notes (Signed)
 Cardiology CONSULT Note    Date:  03/24/2023   ID:  Roniyah, Llorens 12-05-1950, MRN [id number]  PCP:  Georgann Housekeeper, MD  Cardiologist:  Armanda Magic, MD   Chief Complaint  Patient presents with   New Patient (Initial Visit)    Heart murmur    Patient Profile: [name] is a 73 y.o. female who is being seen today for the evaluation of heart murmur at the request of Georgann Housekeeper, MD.  History of Present Illness:  [name] is a 73 y.o. female who is being seen today for the evaluation of heart murmur at the request of Georgann Housekeeper, MD.  This is a 73 year old female with a history of abdominal hernia, anxiety, depression, GERD and hypertension.   Recently was noted to have a heart murmur on exam and is now referred for further evaluation  She was supposed to undergo a colonoscopy but was found to have a heart murmur and recommended cardiology eval before undergoing her procedure. A few weeks ago she had some chest pain that she describes as a heavniess in the upper left chest with no radiation that has occurred several times.  There was no associated sx of nausea or diaphoresis.  She does admit to DOE when physically exerting herself.  She denies any PND, orthopnea, LE edema, dizziness, palpitations or syncope. She is compliant with her meds and is tolerating meds with no SE.    Past Medical History:  Diagnosis Date   Abdominal hernia    Anxiety    Chronic back pain    Closed right ankle fracture    Depression    GERD (gastroesophageal reflux disease)    Headache    migraine   Hypertension     Past Surgical History:  Procedure Laterality Date   CESAREAN SECTION  1983, 1985   x2   COLONOSCOPY     ORIF ANKLE FRACTURE Right 04/27/2019   Procedure: OPEN REDUCTION INTERNAL FIXATION (ORIF) ANKLE FRACTURE;  Surgeon: Yolonda Kida, MD;  Location: [facility] OR;  Service: Orthopedics;  Laterality: Right;  2 hrs RNFA   WISDOM TOOTH EXTRACTION      Current  Medications: Current Meds  Medication Sig   amLODipine (NORVASC) 5 MG tablet Take 5 mg by mouth daily.    buPROPion (WELLBUTRIN XL) 300 MG 24 hr tablet Take 300 mg by mouth daily after breakfast.    Calcium Carb-Cholecalciferol (CALCIUM + D3 PO) Take 1 tablet by mouth in the morning and at bedtime.   Cholecalciferol (VITAMIN D3) 25 MCG (1000 UT) CAPS Take 1 capsule by mouth daily.   FLUoxetine (PROZAC) 20 MG capsule Take 60 mg by mouth every morning.   hydrOXYzine (ATARAX) 10 MG tablet Take 10-30 mg by mouth at bedtime as needed.   Multiple Vitamin (MULTIVITAMIN WITH MINERALS) TABS tablet Take 1 tablet by mouth every evening.   omeprazole (PRILOSEC) 20 MG capsule Take 20 mg by mouth daily before breakfast.    rizatriptan (MAXALT) 10 MG tablet Take 1 tablet (10 mg total) by mouth daily as needed. (Patient taking differently: Take 10 mg by mouth every 2 (two) hours as needed for migraine.)   topiramate (TOPAMAX) 100 MG tablet Take 1 tablet (100 mg total) by mouth at bedtime.   Current Facility-Administered Medications for the 03/24/23 encounter (Office Visit) with Quintella Reichert, MD  Medication   0.9 %  sodium chloride infusion    Allergies:   Elemental sulfur  Social History   Socioeconomic History   Marital status: Widowed    Spouse name: Not on file   Number of children: 2   Years of education: 12th   Highest education level: Not on file  Occupational History   Occupation: Retired  Tobacco Use   Smoking status: Never   Smokeless tobacco: Never  Vaping Use   Vaping status: Never Used  Substance and Sexual Activity   Alcohol use: No   Drug use: No   Sexual activity: Not on file  Other Topics Concern   Not on file  Social History Narrative   Patient lives at home alone.   Caffeine Use: 1 soda daily   Social Drivers of Corporate investment banker Strain: Not on file  Food Insecurity: Not on file  Transportation Needs: Not on file  Physical Activity: Not on file   Stress: Not on file  Social Connections: Not on file     Family History:  The patient's family history includes Brain cancer in her father; Heart Problems in her mother.   ROS:   Please see the history of present illness.    ROS All other systems reviewed and are negative.      No data to display             PHYSICAL EXAM:   VS:  BP (!) 142/78   Pulse 88   Ht 5\' 5"  (1.651 m)   Wt 140 lb 6.4 oz (63.7 kg)   SpO2 97%   BMI 23.36 kg/m    GEN: Well nourished, well developed, in no acute distress  HEENT: normal  Neck: no JVD, carotid bruits, or masses Cardiac: RRR; no rubs, or gallops,no edema.  Intact distal pulses bilaterally. 1/6 SM at RUSB Respiratory:  clear to auscultation bilaterally, normal work of breathing GI: soft, nontender, nondistended, + BS MS: no deformity or atrophy  Skin: warm and dry, no rash Neuro:  Alert and Oriented x 3, Strength and sensation are intact Psych: euthymic mood, full affect  Wt Readings from Last 3 Encounters:  03/24/23 140 lb 6.4 oz (63.7 kg)  12/16/22 134 lb 6 oz (61 kg)  04/27/19 135 lb (61.2 kg)      Studies/Labs Reviewed:   EKG Interpretation Date/Time:  Monday March 24 2023 13:29:01 EST Ventricular Rate:  88 PR Interval:  192 QRS Duration:  82 QT Interval:  384 QTC Calculation: 464 R Axis:   102  Text Interpretation: Normal sinus rhythm Rightward axis No previous ECGs available Confirmed by Armanda Magic ([id number]) on 03/24/2023 1:35:11 PM       Recent Labs: No results found for requested labs within last 365 days.   Lipid Panel No results found for: "CHOL", "TRIG", "HDL", "CHOLHDL", "VLDL", "LDLCALC", "LDLDIRECT"   ASSESSMENT:    1. Heart murmur   2. Primary hypertension      PLAN:  In order of problems listed above:  Heart murmur -Will get a 2D echo to assess for valvular heart disease  Hypertension -BP controlled on exam today -Continue prescription drug managed with amlodipine 5 mg daily with as  needed refills  Chest pain -very hard for her to describe the discomfort but has been having DOE as well -will get a coronary CTA to define coronary anatomy  Time Spent: 20 minutes total time of encounter, including 15 minutes spent in face-to-face patient care on the date of this encounter. This time includes coordination of care and counseling regarding above mentioned problem list.  Remainder of non-face-to-face time involved reviewing chart documents/testing relevant to the patient encounter and documentation in the medical record. I have independently reviewed documentation from referring provider  Followup:  PRN  Medication Adjustments/Labs and Tests Ordered: Current medicines are reviewed at length with the patient today.  Concerns regarding medicines are outlined above.  Medication changes, Labs and Tests ordered today are listed in the Patient Instructions below.  There are no Patient Instructions on file for this visit.   Signed, Armanda Magic, MD  03/24/2023 1:38 PM    [facility] [address] [place], [place], [place]  [address] Phone: [phone]; Fax: [phone]

## 2023-03-24 NOTE — Patient Instructions (Signed)
 Medication Instructions:  The current medical regimen is effective;  continue present plan and medications.  *If you need a refill on your cardiac medications before your next appointment, please call your pharmacy*  Testing/Procedures: Your physician has requested that you have an echocardiogram. Echocardiography is a painless test that uses sound waves to create images of your heart. It provides your doctor with information about the size and shape of your heart and how well your heart's chambers and valves are working. This procedure takes approximately one hour. There are no restrictions for this procedure. Please do NOT wear cologne, perfume, aftershave, or lotions (deodorant is allowed). Please arrive 15 minutes prior to your appointment time.  Please note: We ask at that you not bring children with you during ultrasound (echo/ vascular) testing. Due to room size and safety concerns, children are not allowed in the ultrasound rooms during exams. Our front office staff cannot provide observation of children in our lobby area while testing is being conducted. An adult accompanying a patient to their appointment will only be allowed in the ultrasound room at the discretion of the ultrasound technician under special circumstances. We apologize for any inconvenience.    Your cardiac CT will be scheduled at:   [facility] [address] [place], [place] [address] [phone]  Please arrive at the [facility] and Children's Entrance (Entrance C2) of [facility] 30 minutes prior to test start time. You can use the FREE valet parking offered at entrance C (encouraged to control the heart rate for the test)  Proceed to the [facility] Radiology Department (first floor) to check-in and test prep.  All radiology patients and guests should use entrance C2 at [facility], accessed from [facility], even though the hospital's physical address listed is [address].    Please follow these instructions carefully (unless otherwise directed):  An IV will be required for this test and Nitroglycerin will be given.  Hold all erectile dysfunction medications at least 3 days (72 hrs) prior to test. (Ie viagra, cialis, sildenafil, tadalafil, etc)   On the Night Before the Test: Be sure to Drink plenty of water. Do not consume any caffeinated/decaffeinated beverages or chocolate 12 hours prior to your test. Do not take any antihistamines 12 hours prior to your test.  On the Day of the Test: Drink plenty of water until 1 hour prior to the test. Do not eat any food 1 hour prior to test. You may take your regular medications prior to the test.  Take metoprolol (Lopressor) two hours prior to test. If you take Furosemide/Hydrochlorothiazide/Spironolactone/Chlorthalidone, please HOLD on the morning of the test. Patients who wear a continuous glucose monitor MUST remove the device prior to scanning. FEMALES- please wear underwire-free bra if available, avoid dresses & tight clothing  After the Test: Drink plenty of water. After receiving IV contrast, you may experience a mild flushed feeling. This is normal. On occasion, you may experience a mild rash up to 24 hours after the test. This is not dangerous. If this occurs, you can take Benadryl 25 mg, Zyrtec, Claritin, or Allegra and increase your fluid intake. (Patients taking Tikosyn should avoid Benadryl, and may take Zyrtec, Claritin, or Allegra) If you experience trouble breathing, this can be serious. If it is severe call 911 IMMEDIATELY. If it is mild, please call our office.  We will call to schedule your test 2-4 weeks out understanding that some insurance companies will need an  authorization prior to the service being performed.   For more information and frequently asked questions, please visit our website : [email or web address]  For non-scheduling related questions, please  contact the cardiac imaging nurse navigator should you have any questions/concerns: Cardiac Imaging Nurse Navigators Direct Office Dial: [phone]   For scheduling needs, including cancellations and rescheduling, please call [place], [phone].  Follow-Up: At [facility], you and your health needs are our priority.  As part of our continuing mission to provide you with exceptional heart care, we have created designated Provider Care Teams.  These Care Teams include your primary Cardiologist (physician) and Advanced Practice Providers (APPs -  Physician Assistants and Nurse Practitioners) who all work together to provide you with the care you need, when you need it.  We recommend signing up for the patient portal called "MyChart".  Sign up information is provided on this After Visit Summary.  MyChart is used to connect with patients for Virtual Visits (Telemedicine).  Patients are able to view lab/test results, encounter notes, upcoming appointments, etc.  Non-urgent messages can be sent to your provider as well.   To learn more about what you can do with MyChart, go to [email or web address].    Your next appointment:   Follow up will be based on the results of the above testing.

## 2023-04-01 DIAGNOSIS — R011 Cardiac murmur, unspecified: Secondary | ICD-10-CM | POA: Diagnosis not present

## 2023-04-01 DIAGNOSIS — M8580 Other specified disorders of bone density and structure, unspecified site: Secondary | ICD-10-CM | POA: Diagnosis not present

## 2023-04-01 DIAGNOSIS — R945 Abnormal results of liver function studies: Secondary | ICD-10-CM | POA: Diagnosis not present

## 2023-04-01 DIAGNOSIS — F331 Major depressive disorder, recurrent, moderate: Secondary | ICD-10-CM | POA: Diagnosis not present

## 2023-04-01 DIAGNOSIS — I10 Essential (primary) hypertension: Secondary | ICD-10-CM | POA: Diagnosis not present

## 2023-04-01 DIAGNOSIS — N1831 Chronic kidney disease, stage 3a: Secondary | ICD-10-CM | POA: Diagnosis not present

## 2023-04-16 ENCOUNTER — Encounter: Payer: Self-pay | Admitting: Cardiology

## 2023-04-16 ENCOUNTER — Telehealth (HOSPITAL_COMMUNITY): Payer: Self-pay | Admitting: *Deleted

## 2023-04-16 ENCOUNTER — Telehealth: Payer: Self-pay

## 2023-04-16 ENCOUNTER — Ambulatory Visit (HOSPITAL_BASED_OUTPATIENT_CLINIC_OR_DEPARTMENT_OTHER)

## 2023-04-16 DIAGNOSIS — R931 Abnormal findings on diagnostic imaging of heart and coronary circulation: Secondary | ICD-10-CM | POA: Diagnosis not present

## 2023-04-16 DIAGNOSIS — R011 Cardiac murmur, unspecified: Secondary | ICD-10-CM | POA: Diagnosis not present

## 2023-04-16 DIAGNOSIS — R072 Precordial pain: Secondary | ICD-10-CM | POA: Diagnosis not present

## 2023-04-16 DIAGNOSIS — I351 Nonrheumatic aortic (valve) insufficiency: Secondary | ICD-10-CM

## 2023-04-16 LAB — ECHOCARDIOGRAM COMPLETE
AR max vel: 2.08 cm2
AV Area VTI: 2.06 cm2
AV Area mean vel: 1.9 cm2
AV Mean grad: 7 mmHg
AV Peak grad: 13.1 mmHg
Ao pk vel: 1.81 m/s
S' Lateral: 1.54 cm

## 2023-04-16 NOTE — Telephone Encounter (Signed)
 Reaching out to patient to offer assistance regarding upcoming cardiac imaging study; pt verbalizes understanding of appt date/time, parking situation and where to check in, pre-test NPO status and medications ordered, and verified current allergies; name and call back number provided for further questions should they arise Johney Frame RN Navigator Cardiac Imaging [facility] [phone] office [phone] cell

## 2023-04-16 NOTE — Telephone Encounter (Signed)
 The patient has been notified of the result and verbalized understanding.  All questions (if any) were answered. An echo was ordered for 1 year from now. Pt aware that someone will be reaching out to schedule. Erick Alley, RN 04/16/2023 4:47 PM

## 2023-04-16 NOTE — Telephone Encounter (Signed)
-----   Message from Armanda Magic sent at 04/16/2023  4:36 PM EDT ----- Echo showed normal pumping function of the heart EF 60 to 65%, trivial leakiness of the mitral valve, moderate leakiness of the aortic valve.  Please repeat echo in 1 year for aortic insufficiency

## 2023-04-17 ENCOUNTER — Ambulatory Visit (HOSPITAL_COMMUNITY)
Admission: RE | Admit: 2023-04-17 | Discharge: 2023-04-17 | Disposition: A | Discharge status: Home / Self Care | Source: Ambulatory Visit | Attending: Cardiology | Admitting: Cardiology

## 2023-04-17 ENCOUNTER — Other Ambulatory Visit: Payer: Self-pay | Admitting: Cardiology

## 2023-04-17 ENCOUNTER — Ambulatory Visit (HOSPITAL_BASED_OUTPATIENT_CLINIC_OR_DEPARTMENT_OTHER)
Admission: RE | Admit: 2023-04-17 | Discharge: 2023-04-17 | Disposition: A | Discharge status: Home / Self Care | Source: Ambulatory Visit | Attending: Cardiology | Admitting: Cardiology

## 2023-04-17 DIAGNOSIS — I2510 Atherosclerotic heart disease of native coronary artery without angina pectoris: Secondary | ICD-10-CM | POA: Diagnosis not present

## 2023-04-17 DIAGNOSIS — R931 Abnormal findings on diagnostic imaging of heart and coronary circulation: Secondary | ICD-10-CM

## 2023-04-17 DIAGNOSIS — R072 Precordial pain: Secondary | ICD-10-CM

## 2023-04-17 MED ORDER — METOPROLOL TARTRATE 5 MG/5ML IV SOLN: 10.0000 mg | Freq: Once | INTRAVENOUS | Status: DC | PRN

## 2023-04-17 MED ORDER — NITROGLYCERIN 0.4 MG SL SUBL
0.8000 mg | SUBLINGUAL_TABLET | Freq: Once | SUBLINGUAL | Status: AC
  Administered 2023-04-17: 0.8 mg via SUBLINGUAL

## 2023-04-17 MED ORDER — NITROGLYCERIN 0.4 MG SL SUBL
SUBLINGUAL_TABLET | SUBLINGUAL | Status: AC
  Filled 2023-04-17: qty 2

## 2023-04-17 MED ORDER — DILTIAZEM HCL 25 MG/5ML IV SOLN: 10.0000 mg | INTRAVENOUS | Status: DC | PRN

## 2023-04-17 MED ORDER — IOHEXOL 350 MG/ML SOLN
9.0000 mL | Freq: Once | INTRAVENOUS | Status: AC | PRN
  Administered 2023-04-17: 9 mL via INTRAVENOUS

## 2023-04-20 ENCOUNTER — Encounter: Payer: Self-pay | Admitting: Cardiology

## 2023-04-20 DIAGNOSIS — I2510 Atherosclerotic heart disease of native coronary artery without angina pectoris: Secondary | ICD-10-CM | POA: Insufficient documentation

## 2023-04-23 ENCOUNTER — Telehealth: Payer: Self-pay

## 2023-04-23 ENCOUNTER — Other Ambulatory Visit (HOSPITAL_COMMUNITY): Payer: Self-pay

## 2023-04-23 DIAGNOSIS — Z79899 Other long term (current) drug therapy: Secondary | ICD-10-CM

## 2023-04-23 MED ORDER — METOPROLOL SUCCINATE ER 25 MG PO TB24
25.0000 mg | ORAL_TABLET | Freq: Every day | ORAL | 3 refills | Status: DC
  Filled 2023-04-23: qty 90, 90d supply, fill #0

## 2023-04-23 MED ORDER — METOPROLOL SUCCINATE ER 25 MG PO TB24: 25.0000 mg | ORAL_TABLET | Freq: Every day | ORAL | 3 refills | Status: AC

## 2023-04-23 MED ORDER — ASPIRIN 81 MG PO TBEC: 81.0000 mg | DELAYED_RELEASE_TABLET | Freq: Every day | ORAL | Status: AC

## 2023-04-23 NOTE — Telephone Encounter (Signed)
 Pt c/o medication issue:  1. Name of Medication:   metoprolol succinate (TOPROL XL) 25 MG 24 hr tablet   2. How are you currently taking this medication (dosage and times per day)?   3. Are you having a reaction (difficulty breathing--STAT)?   4. What is your medication issue?    Patient stated she has further questions regarding taking this medications and noted that she gets her medication delivered through [facility] pharmacy.

## 2023-04-23 NOTE — Telephone Encounter (Signed)
 I spoke with patient and answered her questions.  She will get first prescription at [facility] and then will change to mail order if no changes made at May office visit.

## 2023-04-23 NOTE — Telephone Encounter (Signed)
 The patient has been notified of the result and verbalized understanding.  All questions (if any) were answered. Toprol XL 25 mg daily and ASA 81 mg daily was ordered. An FLP and an ALT were ordered and released. Pt aware to come in and have them drawn. A f/u appointment was made for 5/12 at 8:20 AM. Pt told to call 911 should she have chest pain. Erick Alley, RN 04/23/2023 1:14 PM

## 2023-04-23 NOTE — Telephone Encounter (Signed)
-----   Message from Armanda Magic sent at 04/20/2023  8:30 PM EDT ----- Coronary CTA showed coronary Ca score of 883 with severe total plaque volume and moderate stenosis of of the ostial and mid LAD and prox diag and occluded nondominant RCA.  FFR only mildly abnormal in the distal LAD and LCx.   At this time recommend medical therapy and if she has recurrent CP may need cath.  Start Toprol XL 25mg  daily, ASA 81mg  daily and  have patient come in for FLP and ALT.   Followup with me in 4 weeks.  If she has any more CP she needs to call immediately

## 2023-04-28 ENCOUNTER — Telehealth: Payer: Self-pay

## 2023-04-28 NOTE — Telephone Encounter (Signed)
 Spoke with pt regarding her results. Pt verbalized understanding. All questions, if any, were answered. CT was routed to PCP. Pt aware to follow up with them.

## 2023-04-28 NOTE — Telephone Encounter (Signed)
-----   Message from Armanda Magic sent at 04/27/2023  8:28 AM EDT ----- Non cardiac portion of chest CT showed a large paraesophageal hiatal hernia>suspect this is the etiology of patient's CP and SOB. Please her in with her PCP ASAP for further evaulation

## 2023-05-07 DIAGNOSIS — N1831 Chronic kidney disease, stage 3a: Secondary | ICD-10-CM | POA: Diagnosis not present

## 2023-05-08 DIAGNOSIS — N1831 Chronic kidney disease, stage 3a: Secondary | ICD-10-CM | POA: Diagnosis not present

## 2023-05-08 DIAGNOSIS — R319 Hematuria, unspecified: Secondary | ICD-10-CM | POA: Diagnosis not present

## 2023-05-09 DIAGNOSIS — F419 Anxiety disorder, unspecified: Secondary | ICD-10-CM | POA: Diagnosis not present

## 2023-05-09 DIAGNOSIS — F332 Major depressive disorder, recurrent severe without psychotic features: Secondary | ICD-10-CM | POA: Diagnosis not present

## 2023-05-15 DIAGNOSIS — Z79899 Other long term (current) drug therapy: Secondary | ICD-10-CM | POA: Diagnosis not present

## 2023-05-15 LAB — LIPID PANEL
Chol/HDL Ratio: 3.9 ratio (ref 0.0–4.4)
Cholesterol, Total: 220 mg/dL — ABNORMAL HIGH (ref 100–199)
HDL: 57 mg/dL (ref >39)
LDL Chol Calc (NIH): 141 mg/dL — ABNORMAL HIGH (ref 0–99)
Triglycerides: 124 mg/dL (ref 0–149)
VLDL Cholesterol Cal: 22 mg/dL (ref 5–40)

## 2023-05-15 LAB — ALT: ALT: 27 IU/L (ref 0–32)

## 2023-05-20 ENCOUNTER — Telehealth: Payer: Self-pay | Admitting: *Deleted

## 2023-05-20 DIAGNOSIS — E785 Hyperlipidemia, unspecified: Secondary | ICD-10-CM

## 2023-05-20 MED ORDER — ATORVASTATIN CALCIUM 20 MG PO TABS: 20.0000 mg | ORAL_TABLET | Freq: Every day | ORAL | 3 refills | Status: DC

## 2023-05-20 NOTE — Telephone Encounter (Signed)
-----   Message from Gaylyn Keas sent at 05/19/2023  7:50 PM EDT ----- LDL goal less than 70 and her LDL is too high given her coronary CTA that showed CAD.  Please have her start atorvastatin 20 mg daily with repeat FLP and ALT in 6 weeks

## 2023-05-20 NOTE — Telephone Encounter (Signed)
Spoke with pt, aware of the recommendations New script sent to the pharmacy  Lab orders mailed to the pt

## 2023-05-21 NOTE — Progress Notes (Signed)
 See telephone note from 4/29

## 2023-06-02 ENCOUNTER — Encounter: Payer: Self-pay | Admitting: Cardiology

## 2023-06-02 ENCOUNTER — Ambulatory Visit: Attending: Cardiology | Admitting: Cardiology

## 2023-06-02 VITALS — BP 110/58 | HR 75 | Ht 65.0 in | Wt 139.2 lb

## 2023-06-02 DIAGNOSIS — E785 Hyperlipidemia, unspecified: Secondary | ICD-10-CM | POA: Diagnosis not present

## 2023-06-02 DIAGNOSIS — I2510 Atherosclerotic heart disease of native coronary artery without angina pectoris: Secondary | ICD-10-CM | POA: Diagnosis not present

## 2023-06-02 DIAGNOSIS — I10 Essential (primary) hypertension: Secondary | ICD-10-CM | POA: Diagnosis not present

## 2023-06-02 DIAGNOSIS — I351 Nonrheumatic aortic (valve) insufficiency: Secondary | ICD-10-CM

## 2023-06-02 NOTE — Patient Instructions (Signed)
 Medication Instructions:  Your physician recommends that you continue on your current medications as directed. Please refer to the Current Medication list given to you today.  *If you need a refill on your cardiac medications before your next appointment, please call your pharmacy*  Lab Work: Please complete a FASTING lipid panel and ALT in 6 weeks If you have labs (blood work) drawn today and your tests are completely normal, you will receive your results only by: MyChart Message (if you have MyChart) OR A paper copy in the mail If you have any lab test that is abnormal or we need to change your treatment, we will call you to review the results.  Testing/Procedures: Your physician has requested that you have an echocardiogram in March 2026. Echocardiography is a painless test that uses sound waves to create images of your heart. It provides your doctor with information about the size and shape of your heart and how well your heart's chambers and valves are working. This procedure takes approximately one hour. There are no restrictions for this procedure. Please do NOT wear cologne, perfume, aftershave, or lotions (deodorant is allowed). Please arrive 15 minutes prior to your appointment time.  Please note: We ask at that you not bring children with you during ultrasound (echo/ vascular) testing. Due to room size and safety concerns, children are not allowed in the ultrasound rooms during exams. Our front office staff cannot provide observation of children in our lobby area while testing is being conducted. An adult accompanying a patient to their appointment will only be allowed in the ultrasound room at the discretion of the ultrasound technician under special circumstances. We apologize for any inconvenience.   Follow-Up: At [facility], you and your health needs are our priority.  As part of our continuing mission to provide you with exceptional heart care, our providers are all  part of one team.  This team includes your primary Cardiologist (physician) and Advanced Practice Providers or APPs (Physician Assistants and Nurse Practitioners) who all work together to provide you with the care you need, when you need it.  Your next appointment:   1 year(s)  Provider:   Dr. Gaylyn Keas, MD

## 2023-06-02 NOTE — Addendum Note (Signed)
 Addended by: Cherylyn Cos on: 06/02/2023 08:46 AM   Modules accepted: Orders

## 2023-06-02 NOTE — Addendum Note (Signed)
 Addended by: Cherylyn Cos on: 06/02/2023 08:44 AM   Modules accepted: Orders

## 2023-06-02 NOTE — Progress Notes (Signed)
 Cardiology  Note    Date:  06/02/2023   ID:  Alice, Parker [birth year], MRN [id number]  PCP:  Jearldine Mina, MD  Cardiologist:  Gaylyn Keas, MD   Chief Complaint  Patient presents with   Coronary Artery Disease   Hypertension   Hyperlipidemia   Aortic Insuffiency    History of Present Illness:  [name] is a 73 y.o. female with a history of abdominal hernia, anxiety, depression, GERD and hypertension.  Recently was noted to have a heart murmur on exam and was referred for evaluation.   When I saw her initially she also had had some chest pain. 2D echo showed EF 60 to 65% with trivial MR and moderate AR with aortic valve calcifications but no AS.  Coronary CTA showed a large paraesophageal hiatal hernia.  Also showed a coronary calcium  score of 883 with severe plaque volume, 50 to 69% ostial/mid LAD and proximal diagonal, 25 to 49% ostial/distal left circumflex, <25% mid left circumflex, nondominant RCA with 50 to 69% proximal stenosis followed by CTO of the mid RCA.  FFR demonstrated mildly abnormal distal LAD and left circumflex flow but it was in the very distal vessels and medical therapy was recommended.  SHe is here today for followup and is doing well.  She denies any chest pain or pressure, SOB, DOE, PND, orthopnea, LE edema, dizziness, palpitations or syncope. She is compliant with her meds and is tolerating meds with no SE.    Past Medical History:  Diagnosis Date   Abdominal hernia    Anxiety    Aortic insufficiency    Moderate by echo 03/2023   CAD (coronary artery disease), native coronary artery    Coronary CTA showed coronary Ca score of 883 with severe total plaque volume and moderate stenosis of of the ostial and mid LAD and prox diag and occluded nondominant RCA.  FFR only mildly abnormal in the distal LAD and LCx.   Chronic back pain    Closed right ankle fracture    Depression    GERD (gastroesophageal reflux disease)    Headache    migraine    Hypertension     Past Surgical History:  Procedure Laterality Date   CESAREAN SECTION  1983, 1985   x2   COLONOSCOPY     ORIF ANKLE FRACTURE Right 04/27/2019   Procedure: OPEN REDUCTION INTERNAL FIXATION (ORIF) ANKLE FRACTURE;  Surgeon: Janeth Medicus, MD;  Location: [facility] OR;  Service: Orthopedics;  Laterality: Right;  2 hrs RNFA   WISDOM TOOTH EXTRACTION      Current Medications: Current Meds  Medication Sig   amLODipine (NORVASC) 5 MG tablet Take 5 mg by mouth daily.    aspirin  EC 81 MG tablet Take 1 tablet (81 mg total) by mouth daily. Swallow whole.   atorvastatin  (LIPITOR) 20 MG tablet Take 1 tablet (20 mg total) by mouth daily.   buPROPion (WELLBUTRIN XL) 300 MG 24 hr tablet Take 300 mg by mouth daily after breakfast.    Calcium  Carb-Cholecalciferol (CALCIUM  + D3 PO) Take 1 tablet by mouth in the morning and at bedtime.   Cholecalciferol (VITAMIN D3) 25 MCG (1000 UT) CAPS Take 1 capsule by mouth daily.   FLUoxetine (PROZAC) 20 MG capsule Take 60 mg by mouth every morning.   hydrOXYzine (ATARAX) 10 MG tablet Take 10-30 mg by mouth at bedtime as needed.   metoprolol  succinate (TOPROL  XL) 25 MG 24 hr tablet Take 1 tablet (25  mg total) by mouth daily.   metoprolol  tartrate (LOPRESSOR ) 100 MG tablet Take 1 tablet (100 mg total) by mouth as directed. Take one tablet 2 hours before your CT scan   Multiple Vitamin (MULTIVITAMIN WITH MINERALS) TABS tablet Take 1 tablet by mouth every evening.   omeprazole (PRILOSEC) 20 MG capsule Take 20 mg by mouth daily before breakfast.    rizatriptan  (MAXALT ) 10 MG tablet Take 1 tablet (10 mg total) by mouth daily as needed. (Patient taking differently: Take 10 mg by mouth every 2 (two) hours as needed for migraine.)   topiramate  (TOPAMAX ) 100 MG tablet Take 1 tablet (100 mg total) by mouth at bedtime.   Current Facility-Administered Medications for the 06/02/23 encounter (Office Visit) with Alice Matsu, MD  Medication   0.9 %  sodium  chloride infusion    Allergies:   Elemental sulfur   Social History   Socioeconomic History   Marital status: Widowed    Spouse name: Not on file   Number of children: 2   Years of education: 12th   Highest education level: Not on file  Occupational History   Occupation: Retired  Tobacco Use   Smoking status: Never   Smokeless tobacco: Never  Vaping Use   Vaping status: Never Used  Substance and Sexual Activity   Alcohol use: No   Drug use: No   Sexual activity: Not on file  Other Topics Concern   Not on file  Social History Narrative   Patient lives at home alone.   Caffeine Use: 1 soda daily   Social Drivers of Corporate investment banker Strain: Not on file  Food Insecurity: Not on file  Transportation Needs: Not on file  Physical Activity: Not on file  Stress: Not on file  Social Connections: Not on file     Family History:  The patient's family history includes Brain cancer in her father; Heart Problems in her mother.   ROS:   Please see the history of present illness.    ROS All other systems reviewed and are negative.      No data to display             PHYSICAL EXAM:   VS:  BP (!) 110/58   Pulse 75   Ht 5\' 5"  (1.651 m)   Wt 139 lb 3.2 oz (63.1 kg)   SpO2 93%   BMI 23.16 kg/m    GEN: Well nourished, well developed in no acute distress HEENT: Normal NECK: No JVD; No carotid bruits LYMPHATICS: No lymphadenopathy CARDIAC:RRR, no murmurs, rubs, gallops RESPIRATORY:  Clear to auscultation without rales, wheezing or rhonchi  ABDOMEN: Soft, non-tender, non-distended MUSCULOSKELETAL:  No edema; No deformity  SKIN: Warm and dry NEUROLOGIC:  Alert and oriented x 3 PSYCHIATRIC:  Normal affect  Wt Readings from Last 3 Encounters:  06/02/23 139 lb 3.2 oz (63.1 kg)  03/24/23 140 lb 6.4 oz (63.7 kg)  12/16/22 134 lb 6 oz (61 kg)      Studies/Labs Reviewed:          Recent Labs: 05/15/2023: ALT 27   Lipid Panel    Component Value  Date/Time   CHOL 220 (H) 05/15/2023 1004   TRIG 124 05/15/2023 1004   HDL 57 05/15/2023 1004   CHOLHDL 3.9 05/15/2023 1004   LDLCALC 141 (H) 05/15/2023 1004     ASSESSMENT:    1. Aortic valve insufficiency, etiology of cardiac valve disease unspecified   2. Primary hypertension  3. Coronary artery disease involving native coronary artery of native heart without angina pectoris   4. Hyperlipidemia LDL goal <70       PLAN:  In order of problems listed above:  Aortic regurgitation - Moderate 2D echo 03/2023 - Mild aortic calcifications but no AS - Will need to follow yearly echo to make sure this does not progress  Hypertension - BP controlled on exam today - Continue amlodipine 5 mg daily, Toprol  XL 25 mg daily with as needed refills  ASCAD - Coronary CTA demonstrated  coronary calcium  score of 883 with severe plaque volume, 50 to 69% ostial/mid LAD and proximal diagonal, 25 to 49% ostial/distal left circumflex, <25% mid left circumflex, nondominant RCA with 50 to 69% proximal stenosis followed by CTO of the mid RCA.  FFR demonstrated mildly abnormal distal LAD and left circumflex flow but it was in the very distal vessels -Denies any anginal symptoms -Continue aspirin  81 mg daily, atorvastatin  20 mg daily, Toprol  XL 25 mg daily with as needed refills  Hyperlipidemia -LDL goal less than 70 -I have personally reviewed and interpreted outside labs performed by patient's PCP which showed LDL 141, HDL 57 on 05/15/2023>>subsequently started on atorvastatin  -Continue prescription drug managed with atorvastatin  20 mg daily with as needed refills -check FLP and ALT in 6 weeks   Time Spent: 20 minutes total time of encounter, including 15 minutes spent in face-to-face patient care on the date of this encounter. This time includes coordination of care and counseling regarding above mentioned problem list. Remainder of non-face-to-face time involved reviewing chart documents/testing  relevant to the patient encounter and documentation in the medical record. I have independently reviewed documentation from referring provider  Followup: Followup with me in 1 year  Medication Adjustments/Labs and Tests Ordered: Current medicines are reviewed at length with the patient today.  Concerns regarding medicines are outlined above.  Medication changes, Labs and Tests ordered today are listed in the Patient Instructions below.  There are no Patient Instructions on file for this visit.   Signed, Gaylyn Keas, MD  06/02/2023 8:36 AM    [facility] [address] [place], [place], [place]  [address] Phone: [phone]; Fax: [phone]

## 2023-07-17 DIAGNOSIS — E785 Hyperlipidemia, unspecified: Secondary | ICD-10-CM | POA: Diagnosis not present

## 2023-07-17 LAB — LIPID PANEL
Chol/HDL Ratio: 2.8 ratio (ref 0.0–4.4)
Cholesterol, Total: 163 mg/dL (ref 100–199)
HDL: 59 mg/dL (ref >39)
LDL Chol Calc (NIH): 87 mg/dL (ref 0–99)
Triglycerides: 94 mg/dL (ref 0–149)
VLDL Cholesterol Cal: 17 mg/dL (ref 5–40)

## 2023-07-17 LAB — ALT: ALT: 28 IU/L (ref 0–32)

## 2023-07-21 ENCOUNTER — Ambulatory Visit: Payer: Self-pay | Admitting: Cardiology

## 2023-07-21 DIAGNOSIS — Z79899 Other long term (current) drug therapy: Secondary | ICD-10-CM

## 2023-07-21 DIAGNOSIS — I2510 Atherosclerotic heart disease of native coronary artery without angina pectoris: Secondary | ICD-10-CM

## 2023-07-23 MED ORDER — ATORVASTATIN CALCIUM 40 MG PO TABS: 40.0000 mg | ORAL_TABLET | Freq: Every day | ORAL | 3 refills | Status: DC

## 2023-07-23 NOTE — Telephone Encounter (Signed)
-----   Message from Wilbert Bihari sent at 07/23/2023  4:31 PM EDT ----- LDL not at goal - increase Atorvastatin  to 40mg  daily and repeat FLP and ALT in 6 weeks ----- Message ----- From: Janit Geni CROME, RN Sent: 07/23/2023  11:35 AM EDT To: Wilbert JONELLE Bihari, MD  Just double checking, you want the atorvastatin  increased I think? Geni, RN ----- Message ----- From: Bihari Wilbert JONELLE, MD Sent: 07/21/2023   9:52 AM EDT To: Geni CROME Janit, RN  LDL not at goal - increase Losartan to 40mg  daily and repeat FLP and ALT in 6 week ----- Message ----- From: Interface, [facility] Lab Results In Sent: 07/17/2023  11:36 PM EDT To: Wilbert JONELLE Bihari, MD

## 2023-07-23 NOTE — Telephone Encounter (Signed)
 Call to patient to discuss LDL not at goal - Dr. Shlomo recommends to increase Atorvastatin  to 40mg  daily and repeat FLP and ALT in 6 weeks. Patient verbalizes understanding and agrees to plan. Orders placed.

## 2023-07-24 ENCOUNTER — Other Ambulatory Visit: Payer: Self-pay

## 2023-07-24 MED ORDER — ATORVASTATIN CALCIUM 40 MG PO TABS: 40.0000 mg | ORAL_TABLET | Freq: Every day | ORAL | 3 refills | Status: AC

## 2023-07-24 NOTE — Telephone Encounter (Signed)
 Pt would like a c/b from the nurse. Please advise

## 2023-07-24 NOTE — Addendum Note (Signed)
 Addended by: JANIT GENI CROME on: 07/24/2023 04:30 PM   Modules accepted: Orders

## 2023-07-24 NOTE — Telephone Encounter (Signed)
 Pt calling again asking to speak with Geni. Please advise

## 2023-07-24 NOTE — Telephone Encounter (Signed)
 Call to patient to answer questions about increased dose of atorvastatin  and lab orders.  Patient states she does not have enough 20 mg atorvastatin  tabs to make up the 40- mg dose for 6 weeks. New order sent to pharmacy of choice for 40 mg atorvastatin .

## 2023-09-03 DIAGNOSIS — F419 Anxiety disorder, unspecified: Secondary | ICD-10-CM | POA: Diagnosis not present

## 2023-09-03 DIAGNOSIS — F332 Major depressive disorder, recurrent severe without psychotic features: Secondary | ICD-10-CM | POA: Diagnosis not present

## 2023-09-17 DIAGNOSIS — R300 Dysuria: Secondary | ICD-10-CM | POA: Diagnosis not present

## 2023-09-17 DIAGNOSIS — I2510 Atherosclerotic heart disease of native coronary artery without angina pectoris: Secondary | ICD-10-CM | POA: Diagnosis not present

## 2023-09-17 DIAGNOSIS — Z79899 Other long term (current) drug therapy: Secondary | ICD-10-CM | POA: Diagnosis not present

## 2023-09-17 LAB — ALT: ALT: 38 IU/L — ABNORMAL HIGH (ref 0–32)

## 2023-09-17 LAB — LIPID PANEL
Chol/HDL Ratio: 2.6 ratio (ref 0.0–4.4)
Cholesterol, Total: 158 mg/dL (ref 100–199)
HDL: 61 mg/dL (ref >39)
LDL Chol Calc (NIH): 79 mg/dL (ref 0–99)
Triglycerides: 101 mg/dL (ref 0–149)
VLDL Cholesterol Cal: 18 mg/dL (ref 5–40)

## 2023-09-23 NOTE — Addendum Note (Signed)
 Addended by: JANIT GENI CROME on: 09/23/2023 12:19 PM   Modules accepted: Orders

## 2023-09-23 NOTE — Telephone Encounter (Signed)
 Call to patient to advise LDL not at goal and ALT is elevated - patient agrees to referral to lipid clinic, order placed.

## 2023-09-23 NOTE — Telephone Encounter (Signed)
-----   Message from [name] sent at 09/17/2023 10:42 PM EDT ----- LDL not at goal and ALT is elevated - please refer to lipid clinic ----- Message ----- From: Interface, [facility] Lab Results In Sent: 09/17/2023   8:35 PM EDT To: Alice JONELLE Bihari, MD

## 2023-10-01 DIAGNOSIS — N952 Postmenopausal atrophic vaginitis: Secondary | ICD-10-CM | POA: Diagnosis not present

## 2023-10-01 DIAGNOSIS — R300 Dysuria: Secondary | ICD-10-CM | POA: Diagnosis not present

## 2023-10-30 ENCOUNTER — Ambulatory Visit: Admitting: Pharmacist Clinician (PhC)/ Clinical Pharmacy Specialist

## 2023-10-30 DIAGNOSIS — R319 Hematuria, unspecified: Secondary | ICD-10-CM | POA: Diagnosis not present

## 2023-10-30 DIAGNOSIS — N760 Acute vaginitis: Secondary | ICD-10-CM | POA: Diagnosis not present

## 2023-10-30 DIAGNOSIS — R300 Dysuria: Secondary | ICD-10-CM | POA: Diagnosis not present

## 2023-10-30 DIAGNOSIS — R102 Pelvic and perineal pain unspecified side: Secondary | ICD-10-CM | POA: Diagnosis not present

## 2023-11-05 ENCOUNTER — Other Ambulatory Visit: Payer: Self-pay | Admitting: Family

## 2023-11-05 DIAGNOSIS — R319 Hematuria, unspecified: Secondary | ICD-10-CM

## 2023-11-11 ENCOUNTER — Ambulatory Visit
Admission: RE | Admit: 2023-11-11 | Discharge: 2023-11-11 | Disposition: A | Discharge status: Home / Self Care | Source: Ambulatory Visit | Attending: Family | Admitting: Family

## 2023-11-11 DIAGNOSIS — R319 Hematuria, unspecified: Secondary | ICD-10-CM

## 2023-11-11 MED ORDER — IOPAMIDOL (ISOVUE-300) INJECTION 61%
125.0000 mL | Freq: Once | INTRAVENOUS | Status: AC | PRN
Start: 2023-11-11 — End: 2023-11-11
  Administered 2023-11-11: 125 mL via INTRAVENOUS

## 2023-11-13 ENCOUNTER — Inpatient Hospital Stay (HOSPITAL_COMMUNITY)
Admission: EM | Admit: 2023-11-13 | Discharge: 2023-11-18 | DRG: 696 | Disposition: A | Discharge status: Home / Self Care | Attending: Internal Medicine | Admitting: Internal Medicine

## 2023-11-13 ENCOUNTER — Other Ambulatory Visit: Payer: Self-pay

## 2023-11-13 ENCOUNTER — Encounter (HOSPITAL_COMMUNITY): Payer: Self-pay | Admitting: Emergency Medicine

## 2023-11-13 DIAGNOSIS — R310 Gross hematuria: Secondary | ICD-10-CM | POA: Diagnosis not present

## 2023-11-13 DIAGNOSIS — N1831 Chronic kidney disease, stage 3a: Secondary | ICD-10-CM | POA: Diagnosis not present

## 2023-11-13 DIAGNOSIS — C679 Malignant neoplasm of bladder, unspecified: Secondary | ICD-10-CM | POA: Diagnosis present

## 2023-11-13 DIAGNOSIS — F419 Anxiety disorder, unspecified: Secondary | ICD-10-CM | POA: Diagnosis not present

## 2023-11-13 DIAGNOSIS — Z9109 Other allergy status, other than to drugs and biological substances: Secondary | ICD-10-CM

## 2023-11-13 DIAGNOSIS — I2510 Atherosclerotic heart disease of native coronary artery without angina pectoris: Secondary | ICD-10-CM | POA: Diagnosis not present

## 2023-11-13 DIAGNOSIS — R519 Headache, unspecified: Secondary | ICD-10-CM | POA: Diagnosis present

## 2023-11-13 DIAGNOSIS — F32A Depression, unspecified: Secondary | ICD-10-CM | POA: Diagnosis not present

## 2023-11-13 DIAGNOSIS — I10 Essential (primary) hypertension: Secondary | ICD-10-CM | POA: Diagnosis not present

## 2023-11-13 DIAGNOSIS — M549 Dorsalgia, unspecified: Secondary | ICD-10-CM | POA: Diagnosis present

## 2023-11-13 DIAGNOSIS — K219 Gastro-esophageal reflux disease without esophagitis: Secondary | ICD-10-CM | POA: Diagnosis present

## 2023-11-13 DIAGNOSIS — Z808 Family history of malignant neoplasm of other organs or systems: Secondary | ICD-10-CM

## 2023-11-13 DIAGNOSIS — I129 Hypertensive chronic kidney disease with stage 1 through stage 4 chronic kidney disease, or unspecified chronic kidney disease: Secondary | ICD-10-CM | POA: Diagnosis present

## 2023-11-13 DIAGNOSIS — D62 Acute posthemorrhagic anemia: Secondary | ICD-10-CM | POA: Diagnosis present

## 2023-11-13 DIAGNOSIS — Z7982 Long term (current) use of aspirin: Secondary | ICD-10-CM

## 2023-11-13 DIAGNOSIS — N3289 Other specified disorders of bladder: Secondary | ICD-10-CM | POA: Diagnosis not present

## 2023-11-13 DIAGNOSIS — G43909 Migraine, unspecified, not intractable, without status migrainosus: Secondary | ICD-10-CM | POA: Diagnosis present

## 2023-11-13 DIAGNOSIS — C775 Secondary and unspecified malignant neoplasm of intrapelvic lymph nodes: Secondary | ICD-10-CM | POA: Diagnosis present

## 2023-11-13 DIAGNOSIS — G8929 Other chronic pain: Secondary | ICD-10-CM | POA: Diagnosis not present

## 2023-11-13 DIAGNOSIS — N939 Abnormal uterine and vaginal bleeding, unspecified: Secondary | ICD-10-CM | POA: Diagnosis present

## 2023-11-13 DIAGNOSIS — Z79899 Other long term (current) drug therapy: Secondary | ICD-10-CM

## 2023-11-13 DIAGNOSIS — I351 Nonrheumatic aortic (valve) insufficiency: Secondary | ICD-10-CM | POA: Diagnosis present

## 2023-11-13 DIAGNOSIS — Z880 Allergy status to penicillin: Secondary | ICD-10-CM

## 2023-11-13 HISTORY — DX: Headache, unspecified: R51.9

## 2023-11-13 LAB — COMPREHENSIVE METABOLIC PANEL WITH GFR
ALT: 43 U/L (ref 0–44)
AST: 58 U/L — ABNORMAL HIGH (ref 15–41)
Albumin: 3.7 g/dL (ref 3.5–5.0)
Alkaline Phosphatase: 103 U/L (ref 38–126)
Anion gap: 10 (ref 5–15)
BUN: 21 mg/dL (ref 8–23)
CO2: 19 mmol/L — ABNORMAL LOW (ref 22–32)
Calcium: 9.4 mg/dL (ref 8.9–10.3)
Chloride: 112 mmol/L — ABNORMAL HIGH (ref 98–111)
Creatinine, Ser: 1.35 mg/dL — ABNORMAL HIGH (ref 0.44–1.00)
GFR, Estimated: 41 mL/min — ABNORMAL LOW (ref >60)
Glucose, Bld: 116 mg/dL — ABNORMAL HIGH (ref 70–99)
Potassium: 3.8 mmol/L (ref 3.5–5.1)
Sodium: 141 mmol/L (ref 135–145)
Total Bilirubin: 0.5 mg/dL (ref 0.0–1.2)
Total Protein: 6.8 g/dL (ref 6.5–8.1)

## 2023-11-13 LAB — CBC WITH DIFFERENTIAL/PLATELET
Abs Immature Granulocytes: 0.05 K/uL (ref 0.00–0.07)
Basophils Absolute: 0.1 K/uL (ref 0.0–0.1)
Basophils Relative: 1 %
Eosinophils Absolute: 0.1 K/uL (ref 0.0–0.5)
Eosinophils Relative: 1 %
HCT: 31.5 % — ABNORMAL LOW (ref 36.0–46.0)
Hemoglobin: 10 g/dL — ABNORMAL LOW (ref 12.0–15.0)
Immature Granulocytes: 0 %
Lymphocytes Relative: 16 %
Lymphs Abs: 2.3 K/uL (ref 0.7–4.0)
MCH: 31.9 pg (ref 26.0–34.0)
MCHC: 31.7 g/dL (ref 30.0–36.0)
MCV: 100.6 fL — ABNORMAL HIGH (ref 80.0–100.0)
Monocytes Absolute: 1.9 K/uL — ABNORMAL HIGH (ref 0.1–1.0)
Monocytes Relative: 13 %
Neutro Abs: 9.7 K/uL — ABNORMAL HIGH (ref 1.7–7.7)
Neutrophils Relative %: 69 %
Platelets: 338 K/uL (ref 150–400)
RBC: 3.13 MIL/uL — ABNORMAL LOW (ref 3.87–5.11)
RDW: 12.7 % (ref 11.5–15.5)
WBC: 14.2 K/uL — ABNORMAL HIGH (ref 4.0–10.5)
nRBC: 0 % (ref 0.0–0.2)

## 2023-11-13 MED ORDER — OXYCODONE-ACETAMINOPHEN 5-325 MG PO TABS
1.0000 | ORAL_TABLET | ORAL | Status: DC | PRN
  Administered 2023-11-13: 1 via ORAL
  Filled 2023-11-13: qty 1

## 2023-11-13 NOTE — ED Notes (Signed)
 Pt has been sitting on toilet in lobby for around 20 minutes, triage RN notified about increasing pain and medication ordered. Pt states she is not passing any blood clots but still passing large amounts of blood.

## 2023-11-13 NOTE — ED Triage Notes (Signed)
 Patient has been having vaginal bleed all today. She has bladder cancer and was referred by her oncologist. She is also urinating blood.

## 2023-11-13 NOTE — ED Provider Triage Note (Cosign Needed Addendum)
 Emergency Medicine Provider Triage Evaluation Note  [name] , a 73 y.o. female  was evaluated in triage.  Pt complains of vaginal pain in the setting of known bladder cancer.  Patient states she is experiencing vaginal related pain and significant amounts of bleeding with urination.  No abdominal pain, back pain, flank pain.  Review of Systems  Positive: Hematuria Negative: Abdominal pain, back pain  Physical Exam  BP (!) 147/60 (BP Location: Right Arm)   Pulse 71   Temp 97.9 F (36.6 C) (Oral)   Resp 18   Ht 5' 5 (1.651 m)   Wt 62.6 kg   SpO2 97%   BMI 22.96 kg/m  Gen:   Awake, no distress  Resp:  Normal effort MSK:   Moves extremities without difficulty  Other:    Medical Decision Making  Medically screening exam initiated at 7:49 PM.  Appropriate orders placed.  [name] was informed that the remainder of the evaluation will be completed by another provider, this initial triage assessment does not replace that evaluation, and the importance of remaining in the ED until their evaluation is complete.  Labs, urine ordered.  Will allow further imaging workup to be done by main provider in a room.   Torrence Marry RAMAN, PA-C 11/13/23 1951    Torrence Marry RAMAN, PA-C 11/13/23 1955

## 2023-11-13 NOTE — ED Triage Notes (Signed)
 From home. Pt states that she has a Hx of bladder cancer and that today she has been bleeding intermittently all day today. Pt states that she feels like she has to go to the bathroom and notices the blood at that time. Not needing to use pads or anything at this time. Pt reports that this happened last week and she was seen and giben medication for possible kidney stone.

## 2023-11-14 ENCOUNTER — Emergency Department (HOSPITAL_COMMUNITY)

## 2023-11-14 ENCOUNTER — Encounter (HOSPITAL_COMMUNITY): Payer: Self-pay | Admitting: Family Medicine

## 2023-11-14 DIAGNOSIS — N939 Abnormal uterine and vaginal bleeding, unspecified: Secondary | ICD-10-CM | POA: Diagnosis not present

## 2023-11-14 DIAGNOSIS — R519 Headache, unspecified: Secondary | ICD-10-CM | POA: Diagnosis not present

## 2023-11-14 DIAGNOSIS — F32A Depression, unspecified: Secondary | ICD-10-CM | POA: Diagnosis not present

## 2023-11-14 DIAGNOSIS — I2510 Atherosclerotic heart disease of native coronary artery without angina pectoris: Secondary | ICD-10-CM | POA: Diagnosis not present

## 2023-11-14 DIAGNOSIS — I10 Essential (primary) hypertension: Secondary | ICD-10-CM | POA: Diagnosis present

## 2023-11-14 DIAGNOSIS — Z880 Allergy status to penicillin: Secondary | ICD-10-CM | POA: Diagnosis not present

## 2023-11-14 DIAGNOSIS — K219 Gastro-esophageal reflux disease without esophagitis: Secondary | ICD-10-CM | POA: Diagnosis not present

## 2023-11-14 DIAGNOSIS — N1831 Chronic kidney disease, stage 3a: Secondary | ICD-10-CM | POA: Diagnosis present

## 2023-11-14 DIAGNOSIS — N3289 Other specified disorders of bladder: Secondary | ICD-10-CM | POA: Diagnosis present

## 2023-11-14 DIAGNOSIS — F419 Anxiety disorder, unspecified: Secondary | ICD-10-CM | POA: Diagnosis not present

## 2023-11-14 DIAGNOSIS — Z79899 Other long term (current) drug therapy: Secondary | ICD-10-CM | POA: Diagnosis not present

## 2023-11-14 DIAGNOSIS — Z9109 Other allergy status, other than to drugs and biological substances: Secondary | ICD-10-CM | POA: Diagnosis not present

## 2023-11-14 DIAGNOSIS — G8929 Other chronic pain: Secondary | ICD-10-CM

## 2023-11-14 DIAGNOSIS — Z808 Family history of malignant neoplasm of other organs or systems: Secondary | ICD-10-CM | POA: Diagnosis not present

## 2023-11-14 DIAGNOSIS — I129 Hypertensive chronic kidney disease with stage 1 through stage 4 chronic kidney disease, or unspecified chronic kidney disease: Secondary | ICD-10-CM | POA: Diagnosis not present

## 2023-11-14 DIAGNOSIS — R310 Gross hematuria: Secondary | ICD-10-CM | POA: Diagnosis not present

## 2023-11-14 DIAGNOSIS — C679 Malignant neoplasm of bladder, unspecified: Secondary | ICD-10-CM | POA: Diagnosis not present

## 2023-11-14 DIAGNOSIS — Z7982 Long term (current) use of aspirin: Secondary | ICD-10-CM | POA: Diagnosis not present

## 2023-11-14 DIAGNOSIS — R9341 Abnormal radiologic findings on diagnostic imaging of renal pelvis, ureter, or bladder: Secondary | ICD-10-CM | POA: Diagnosis not present

## 2023-11-14 DIAGNOSIS — I351 Nonrheumatic aortic (valve) insufficiency: Secondary | ICD-10-CM | POA: Diagnosis not present

## 2023-11-14 DIAGNOSIS — M549 Dorsalgia, unspecified: Secondary | ICD-10-CM | POA: Diagnosis not present

## 2023-11-14 DIAGNOSIS — D494 Neoplasm of unspecified behavior of bladder: Secondary | ICD-10-CM | POA: Diagnosis not present

## 2023-11-14 DIAGNOSIS — G43909 Migraine, unspecified, not intractable, without status migrainosus: Secondary | ICD-10-CM | POA: Diagnosis not present

## 2023-11-14 DIAGNOSIS — R319 Hematuria, unspecified: Secondary | ICD-10-CM | POA: Diagnosis not present

## 2023-11-14 DIAGNOSIS — D62 Acute posthemorrhagic anemia: Secondary | ICD-10-CM | POA: Diagnosis not present

## 2023-11-14 DIAGNOSIS — C775 Secondary and unspecified malignant neoplasm of intrapelvic lymph nodes: Secondary | ICD-10-CM | POA: Diagnosis not present

## 2023-11-14 LAB — CBC
HCT: 24 % — ABNORMAL LOW (ref 36.0–46.0)
HCT: 25 % — ABNORMAL LOW (ref 36.0–46.0)
Hemoglobin: 8 g/dL — ABNORMAL LOW (ref 12.0–15.0)
Hemoglobin: 8.2 g/dL — ABNORMAL LOW (ref 12.0–15.0)
MCH: 32.9 pg (ref 26.0–34.0)
MCH: 32.4 pg (ref 26.0–34.0)
MCHC: 33.3 g/dL (ref 30.0–36.0)
MCHC: 32.8 g/dL (ref 30.0–36.0)
MCV: 98.8 fL (ref 80.0–100.0)
MCV: 98.8 fL (ref 80.0–100.0)
Platelets: 261 K/uL (ref 150–400)
Platelets: 265 K/uL (ref 150–400)
RBC: 2.43 MIL/uL — ABNORMAL LOW (ref 3.87–5.11)
RBC: 2.53 MIL/uL — ABNORMAL LOW (ref 3.87–5.11)
RDW: 12.9 % (ref 11.5–15.5)
RDW: 13 % (ref 11.5–15.5)
WBC: 10.7 K/uL — ABNORMAL HIGH (ref 4.0–10.5)
WBC: 11.9 K/uL — ABNORMAL HIGH (ref 4.0–10.5)
nRBC: 0 % (ref 0.0–0.2)
nRBC: 0 % (ref 0.0–0.2)

## 2023-11-14 LAB — HEMOGLOBIN AND HEMATOCRIT, BLOOD
HCT: 26.3 % — ABNORMAL LOW (ref 36.0–46.0)
Hemoglobin: 8.5 g/dL — ABNORMAL LOW (ref 12.0–15.0)

## 2023-11-14 LAB — I-STAT CHEM 8, ED
BUN: 24 mg/dL — ABNORMAL HIGH (ref 8–23)
Calcium, Ion: 1.22 mmol/L (ref 1.15–1.40)
Chloride: 108 mmol/L (ref 98–111)
Creatinine, Ser: 1.3 mg/dL — ABNORMAL HIGH (ref 0.44–1.00)
Glucose, Bld: 105 mg/dL — ABNORMAL HIGH (ref 70–99)
HCT: 25 % — ABNORMAL LOW (ref 36.0–46.0)
Hemoglobin: 8.5 g/dL — ABNORMAL LOW (ref 12.0–15.0)
Potassium: 4 mmol/L (ref 3.5–5.1)
Sodium: 141 mmol/L (ref 135–145)
TCO2: 18 mmol/L — ABNORMAL LOW (ref 22–32)

## 2023-11-14 LAB — TYPE AND SCREEN
ABO/RH(D): O POS
Antibody Screen: NEGATIVE

## 2023-11-14 LAB — ABO/RH: ABO/RH(D): O POS

## 2023-11-14 MED ORDER — FAMOTIDINE 20 MG PO TABS
20.0000 mg | ORAL_TABLET | Freq: Every day | ORAL | Status: DC
  Administered 2023-11-14 – 2023-11-18 (×5): 20 mg via ORAL
  Filled 2023-11-14 (×5): qty 1

## 2023-11-14 MED ORDER — ACETAMINOPHEN 650 MG RE SUPP: 650.0000 mg | Freq: Four times a day (QID) | RECTAL | Status: DC | PRN

## 2023-11-14 MED ORDER — BUPROPION HCL ER (XL) 300 MG PO TB24
300.0000 mg | ORAL_TABLET | Freq: Every day | ORAL | Status: DC
  Administered 2023-11-14 – 2023-11-18 (×5): 300 mg via ORAL
  Filled 2023-11-14: qty 1
  Filled 2023-11-14: qty 2
  Filled 2023-11-14 (×2): qty 1
  Filled 2023-11-14: qty 2

## 2023-11-14 MED ORDER — TRANEXAMIC ACID 650 MG PO TABS
1300.0000 mg | ORAL_TABLET | Freq: Once | ORAL | Status: AC
  Administered 2023-11-14: 1300 mg via ORAL
  Filled 2023-11-14 (×2): qty 2

## 2023-11-14 MED ORDER — SODIUM CHLORIDE 0.9 % IR SOLN
3000.0000 mL | Status: DC
  Administered 2023-11-14 – 2023-11-16 (×5): 3000 mL

## 2023-11-14 MED ORDER — LACTATED RINGERS IV BOLUS
1000.0000 mL | Freq: Once | INTRAVENOUS | Status: AC
  Administered 2023-11-14: 1000 mL via INTRAVENOUS

## 2023-11-14 MED ORDER — ACETAMINOPHEN 325 MG PO TABS
650.0000 mg | ORAL_TABLET | Freq: Four times a day (QID) | ORAL | Status: DC | PRN
  Administered 2023-11-14 – 2023-11-17 (×2): 650 mg via ORAL
  Filled 2023-11-14 (×2): qty 2

## 2023-11-14 MED ORDER — FLUOXETINE HCL 20 MG PO CAPS
60.0000 mg | ORAL_CAPSULE | Freq: Every morning | ORAL | Status: DC
  Administered 2023-11-14 – 2023-11-18 (×5): 60 mg via ORAL
  Filled 2023-11-14 (×5): qty 3

## 2023-11-14 MED ORDER — TOPIRAMATE 100 MG PO TABS
100.0000 mg | ORAL_TABLET | Freq: Every day | ORAL | Status: DC
  Administered 2023-11-14 – 2023-11-17 (×4): 100 mg via ORAL
  Filled 2023-11-14 (×3): qty 1
  Filled 2023-11-14: qty 4
  Filled 2023-11-14 (×2): qty 1

## 2023-11-14 MED ORDER — HYOSCYAMINE SULFATE 0.125 MG SL SUBL
0.1250 mg | SUBLINGUAL_TABLET | SUBLINGUAL | Status: DC | PRN
  Administered 2023-11-14 – 2023-11-16 (×7): 0.125 mg via SUBLINGUAL
  Filled 2023-11-14 (×9): qty 1

## 2023-11-14 MED ORDER — OXYCODONE HCL 5 MG PO TABS
5.0000 mg | ORAL_TABLET | ORAL | Status: DC | PRN
  Administered 2023-11-14 – 2023-11-16 (×6): 5 mg via ORAL
  Filled 2023-11-14 (×6): qty 1

## 2023-11-14 MED ORDER — LACTATED RINGERS IV SOLN
INTRAVENOUS | Status: AC
Start: 2023-11-14 — End: 2023-11-15

## 2023-11-14 MED ORDER — SODIUM CHLORIDE 0.9% FLUSH
3.0000 mL | Freq: Two times a day (BID) | INTRAVENOUS | Status: DC
  Administered 2023-11-14 – 2023-11-18 (×9): 3 mL via INTRAVENOUS

## 2023-11-14 MED ORDER — ATORVASTATIN CALCIUM 40 MG PO TABS
40.0000 mg | ORAL_TABLET | Freq: Every day | ORAL | Status: DC
  Administered 2023-11-14 – 2023-11-18 (×5): 40 mg via ORAL
  Filled 2023-11-14 (×5): qty 1

## 2023-11-14 MED ORDER — CHLORHEXIDINE GLUCONATE CLOTH 2 % EX PADS
6.0000 | MEDICATED_PAD | Freq: Every day | CUTANEOUS | Status: DC
  Administered 2023-11-15 – 2023-11-17 (×3): 6 via TOPICAL

## 2023-11-14 MED ORDER — FENTANYL CITRATE (PF) 50 MCG/ML IJ SOSY
12.5000 ug | PREFILLED_SYRINGE | INTRAMUSCULAR | Status: DC | PRN
  Administered 2023-11-14 (×2): 50 ug via INTRAVENOUS
  Administered 2023-11-14: 12.5 ug via INTRAVENOUS
  Administered 2023-11-15: 50 ug via INTRAVENOUS
  Filled 2023-11-14 (×4): qty 1

## 2023-11-14 MED ORDER — HYDROMORPHONE HCL 1 MG/ML IJ SOLN
0.5000 mg | Freq: Once | INTRAMUSCULAR | Status: AC
  Administered 2023-11-14: 0.5 mg via INTRAVENOUS
  Filled 2023-11-14: qty 1

## 2023-11-14 MED ORDER — PROCHLORPERAZINE EDISYLATE 10 MG/2ML IJ SOLN: 5.0000 mg | Freq: Four times a day (QID) | INTRAMUSCULAR | Status: DC | PRN

## 2023-11-14 MED ORDER — PANTOPRAZOLE SODIUM 40 MG PO TBEC
40.0000 mg | DELAYED_RELEASE_TABLET | Freq: Every day | ORAL | Status: DC
  Administered 2023-11-14 – 2023-11-18 (×5): 40 mg via ORAL
  Filled 2023-11-14 (×5): qty 1

## 2023-11-14 NOTE — H&P (Signed)
 History and Physical    [name] FMW:[id number] DOB: 1950-12-07 DOA: 11/13/2023  PCP: Ransom Other, MD   Patient coming from: Home   Chief Complaint: Bloody urine   HPI: [name] is a 73 y.o. female with medical history significant for depression, anxiety, chronic headaches, hypertension, CAD, CKD 3A, and recently identified bladder masses who now presents with gross hematuria.  Patient developed gross hematuria more than a week ago.  She had CT performed on 11/11/2023 which demonstrates 2 bladder masses and a prominent left external iliac lymph node.  Bleeding has worsened and she has developed intermittent difficulty voiding and sharp pain.  She denies fevers or chills.  She denies lightheadedness or chest pain.  ED Course: Upon arrival to the ED, patient is found to be afebrile and saturating well on room air with normal HR and stable BP.  Labs are most notable for creatinine 1.35, WBC 14,200, and hemoglobin 10.0 which decreased to 8.5 after eight hours.  Urology (Dr. Nieves) was consulted by the ED physician and the patient was given a liter of LR, Dilaudid, and had catheter placed for CBI.  Review of Systems:  All other systems reviewed and apart from HPI, are negative.  Past Medical History:  Diagnosis Date   Abdominal hernia    Anxiety    Aortic insufficiency    Moderate by echo 03/2023   CAD (coronary artery disease), native coronary artery    Coronary CTA showed coronary Ca score of 883 with severe total plaque volume and moderate stenosis of of the ostial and mid LAD and prox diag and occluded nondominant RCA.  FFR only mildly abnormal in the distal LAD and LCx.   Chronic back pain    Chronic headache    migraine     CKD stage 3a, GFR 45-59 ml/min (HCC) 11/14/2023   Closed right ankle fracture    Depression    GERD (gastroesophageal reflux disease)    Headache    migraine   Hypertension     Past Surgical History:  Procedure Laterality Date    CESAREAN SECTION  1983, 1985   x2   COLONOSCOPY     ORIF ANKLE FRACTURE Right 04/27/2019   Procedure: OPEN REDUCTION INTERNAL FIXATION (ORIF) ANKLE FRACTURE;  Surgeon: Sharl Selinda Dover, MD;  Location: [facility] OR;  Service: Orthopedics;  Laterality: Right;  2 hrs RNFA   WISDOM TOOTH EXTRACTION      Social History:   reports that she has never smoked. She has never used smokeless tobacco. She reports that she does not drink alcohol and does not use drugs.  Allergies  Allergen Reactions   Elemental Sulfur Nausea And Vomiting and Rash    Family History  Problem Relation Age of Onset   Heart Problems Mother    Brain cancer Father    Colon cancer Neg Hx    Stomach cancer Neg Hx    Esophageal cancer Neg Hx    Pancreatic cancer Neg Hx    Rectal cancer Neg Hx      Prior to Admission medications   Medication Sig Start Date End Date Taking? Authorizing Provider  amLODipine (NORVASC) 5 MG tablet Take 5 mg by mouth daily.  04/08/19   [provider]  aspirin  EC 81 MG tablet Take 1 tablet (81 mg total) by mouth daily. Swallow whole. 04/23/23   Shlomo Wilbert SAUNDERS, MD  atorvastatin  (LIPITOR) 40 MG tablet Take 1 tablet (40 mg total) by mouth daily. 07/24/23 10/22/23  Shlomo,  Wilbert SAUNDERS, MD  buPROPion (WELLBUTRIN XL) 300 MG 24 hr tablet Take 300 mg by mouth daily after breakfast.     [provider]  Calcium  Carb-Cholecalciferol (CALCIUM  + D3 PO) Take 1 tablet by mouth in the morning and at bedtime.    [provider]  Cholecalciferol (VITAMIN D3) 25 MCG (1000 UT) CAPS Take 1 capsule by mouth daily.    [provider]  FLUoxetine (PROZAC) 20 MG capsule Take 60 mg by mouth every morning.    [provider]  hydrOXYzine (ATARAX) 10 MG tablet Take 10-30 mg by mouth at bedtime as needed.    [provider]  metoprolol  succinate (TOPROL  XL) 25 MG 24 hr tablet Take 1 tablet (25 mg total) by mouth daily. 04/23/23   Shlomo Wilbert SAUNDERS, MD  metoprolol  tartrate  (LOPRESSOR ) 100 MG tablet Take 1 tablet (100 mg total) by mouth as directed. Take one tablet 2 hours before your CT scan 03/24/23   Shlomo Wilbert SAUNDERS, MD  Multiple Vitamin (MULTIVITAMIN WITH MINERALS) TABS tablet Take 1 tablet by mouth every evening.    [provider]  omeprazole (PRILOSEC) 20 MG capsule Take 20 mg by mouth daily before breakfast.     [provider]  ondansetron  (ZOFRAN  ODT) 4 MG disintegrating tablet Take 1 tablet (4 mg total) by mouth every 8 (eight) hours as needed for nausea or vomiting. Patient not taking: Reported on 12/16/2022 04/27/19   Sharl Selinda Dover, MD  rizatriptan  (MAXALT ) 10 MG tablet Take 1 tablet (10 mg total) by mouth daily as needed. Patient taking differently: Take 10 mg by mouth every 2 (two) hours as needed for migraine. 08/21/12   Athar, Saima, MD  topiramate  (TOPAMAX ) 100 MG tablet Take 1 tablet (100 mg total) by mouth at bedtime. 08/21/12   Buck Saucer, MD    Physical Exam: Vitals:   11/14/23 0500 11/14/23 0530 11/14/23 0542 11/14/23 0600  BP: 98/74 112/76  (!) 113/53  Pulse: 73 70  70  Resp: 17 20  (!) 22  Temp:   97.9 F (36.6 C)   TempSrc:   Oral   SpO2: 100% 99%  97%  Weight:      Height:        Constitutional: NAD, calm  Eyes: PERTLA, lids and conjunctivae normal ENMT: Mucous membranes are moist. Posterior pharynx clear of any exudate or lesions.   Neck: supple, no masses  Respiratory: no wheezing, no crackles. No accessory muscle use.  Cardiovascular: S1 & S2 heard, regular rate and rhythm. No extremity edema.  Abdomen: No tenderness, soft. Bowel sounds active.  Musculoskeletal: no clubbing / cyanosis. No joint deformity upper and lower extremities.   Skin: no significant rashes, lesions, ulcers. Warm, dry, well-perfused. Neurologic: CN 2-12 grossly intact. Moving all extremities. Alert and oriented.  Psychiatric: Pleasant. Cooperative.    Labs and Imaging on Admission: I have personally reviewed following labs and  imaging studies  CBC: Recent Labs  Lab 11/13/23 2010 11/14/23 0405 11/14/23 0407  WBC 14.2*  --   --   NEUTROABS 9.7*  --   --   HGB 10.0* 8.5* 8.5*  HCT 31.5* 26.3* 25.0*  MCV 100.6*  --   --   PLT 338  --   --    Basic Metabolic Panel: Recent Labs  Lab 11/13/23 2010 11/14/23 0407  NA 141 141  K 3.8 4.0  CL 112* 108  CO2 19*  --   GLUCOSE 116* 105*  BUN 21 24*  CREATININE 1.35* 1.30*  CALCIUM  9.4  --    GFR: Estimated Creatinine Clearance: 33.6 mL/min (A) (by C-G formula based on SCr of 1.3 mg/dL (H)). Liver Function Tests: Recent Labs  Lab 11/13/23 2010  AST 58*  ALT 43  ALKPHOS 103  BILITOT 0.5  PROT 6.8  ALBUMIN 3.7   No results for input(s): LIPASE, AMYLASE in the last 168 hours. No results for input(s): AMMONIA in the last 168 hours. Coagulation Profile: No results for input(s): INR, PROTIME in the last 168 hours. Cardiac Enzymes: No results for input(s): CKTOTAL, CKMB, CKMBINDEX, TROPONINI in the last 168 hours. BNP (last 3 results) No results for input(s): PROBNP in the last 8760 hours. HbA1C: No results for input(s): HGBA1C in the last 72 hours. CBG: No results for input(s): GLUCAP in the last 168 hours. Lipid Profile: No results for input(s): CHOL, HDL, LDLCALC, TRIG, CHOLHDL, LDLDIRECT in the last 72 hours. Thyroid  Function Tests: No results for input(s): TSH, T4TOTAL, FREET4, T3FREE, THYROIDAB in the last 72 hours. Anemia Panel: No results for input(s): VITAMINB12, FOLATE, FERRITIN, TIBC, IRON, RETICCTPCT in the last 72 hours. Urine analysis: No results found for: COLORURINE, APPEARANCEUR, LABSPEC, PHURINE, GLUCOSEU, HGBUR, BILIRUBINUR, KETONESUR, PROTEINUR, UROBILINOGEN, NITRITE, LEUKOCYTESUR Sepsis Labs: @LABRCNTIP (procalcitonin:4,lacticidven:4) )No results found for this or any previous visit (from the past 240 hours).   Radiological Exams on  Admission: US  Renal Result Date: 11/14/2023 EXAM: US  Retroperitoneum Complete, Renal. CLINICAL HISTORY: eval for hematuria. TECHNIQUE: Real-time ultrasound of the retroperitoneum (complete) with image documentation. COMPARISON: CT of the Abdomen and Pelvis dated 11/11/2023. FINDINGS: RIGHT KIDNEY: The right kidney measures 8.7 x 4.3 x 3.7 cm with a volume of 73.3 cc. No hydronephrosis, renal stone, or mass visualized. LEFT KIDNEY: The left kidney measures 8.4 x 4.4 x 3.9 cm with a volume of 75.01 cc. No hydronephrosis, renal stone, or mass visualized. BLADDER: Partially decompressed urinary bladder is identified. Abnormal, asymmetric bladder wall thickening is again noted corresponding to a mass identified within the bladder on the recent CT. Echogenic debris noted within the lumen of the bladder. IMPRESSION: 1. Findings suspicious for a bladder mass with asymmetric bladder wall thickening and intraluminal echogenic debris, correlating with the recent CT-identified bladder mass. 2. No signs of hydronephrosis or kidney mass bilaterally. Electronically signed by: Waddell Calk MD 11/14/2023 05:57 AM EDT RP Workstation: [id number]     Assessment/Plan   1. Gross hematuria; bladder masses  - Presents with worsening gross hematuria, intermittent difficulty voiding, and pain; CT from 11/11/23 is concerning for multifocal bladder malignancy with early nodal metastasis  - Hgb went from 10 to 8.5 in eight hours  - Urology was consulted and catheter placed for CBI in the ED  - Continue bowel rest and bladder irrigation, hold ASA, trend H&H and transfuse if needed  2. CAD  - No anginal symptoms  - Hold ASA, continue Lipitor   3. Hypertension  - Treat as-needed only for now   4. CKD 3A  - Appears close to baseline - Renally-dose medications   5. Depression, anxiety  - Continue Prozac and Wellbutrin    6. Chronic headaches  - Continue Topamax     DVT prophylaxis: SCDs  Code Status: Full  Level of  Care: Level of care: Telemetry Surgical Family Communication: Significant other at bedside  Disposition Plan:  Patient is from: Home  Anticipated d/c is to: Home Anticipated d/c date is: 11/16/23  Patient currently: Pending stable H&H, urology consultation  Consults called: Urology  Admission status: Inpatient  Evalene GORMAN Sprinkles, MD [facility]  11/14/2023, 6:20 AM

## 2023-11-14 NOTE — ED Notes (Signed)
 Pt returned from [place]

## 2023-11-14 NOTE — Consult Note (Signed)
 Urology Consult Note   Requesting Attending Physician:  Rosario Leatrice FERNS, MD Service Providing Consult: Urology  Consulting Attending: Dr. Lovie   Reason for Consult: Gross hematuria  HPI: [name] is seen in consultation for reasons noted above at the request of Rosario Leatrice FERNS, MD. Patient is a 73 y.o. female presenting to [facility] emergency department with complaint of vaginal bleeding.  CT imaging on 11/11/2023 identified 2 bladder tumors measuring 4.7 x 4.2 x 2.3 and 3.2 x 1.9 x 1.5 respectively.  Additionally a left external iliac node was enlarged.  She has an appointment scheduled with Dr. Renda on November 4th at 1 PM.  On my arrival 31F three-way catheter had been placed and CBI was clamped.  Eventually was able to restart irrigation and reviewed case and plan with family. ------------------  Assessment:   73 y.o. female with new finding of multiple bladder masses concerning for malignancy and gross hematuria.   Recommendations: # Bladder masses # Gross hematuria  Gross hematuria most likely from friable masses in bladder. Hand irrigated, returning small but numerous black clots.  Patient has quite a small bladder capacity and experienced notable discomfort with 50cc's of irrigant.  Reduced to 30 cc intervals which patient tolerated much better.  Eventually bladder was cleared and CBI able to be restarted at low-medium gtt.  Pink lemonade irrigant.  Will at least watch her progress for the day before proceeding to the OR.  We will give her a diet and make her n.p.o. again at midnight in the event that she worsens acutely.  Ideally she would keep her appointment with Dr. Renda on November 4 and schedule TURBT.  If bleeding cannot be controlled we will reconsider the procedure urgently.  Urology will follow  Case and plan discussed with Dr. Lovie  Past Medical History: Past Medical History:  Diagnosis Date   Abdominal hernia    Anxiety    Aortic  insufficiency    Moderate by echo 03/2023   CAD (coronary artery disease), native coronary artery    Coronary CTA showed coronary Ca score of 883 with severe total plaque volume and moderate stenosis of of the ostial and mid LAD and prox diag and occluded nondominant RCA.  FFR only mildly abnormal in the distal LAD and LCx.   Chronic back pain    Chronic headache    migraine     CKD stage 3a, GFR 45-59 ml/min (HCC) 11/14/2023   Closed right ankle fracture    Depression    GERD (gastroesophageal reflux disease)    Headache    migraine   Hypertension     Past Surgical History:  Past Surgical History:  Procedure Laterality Date   CESAREAN SECTION  1983, 1985   x2   COLONOSCOPY     ORIF ANKLE FRACTURE Right 04/27/2019   Procedure: OPEN REDUCTION INTERNAL FIXATION (ORIF) ANKLE FRACTURE;  Surgeon: Sharl Selinda Dover, MD;  Location: [facility] OR;  Service: Orthopedics;  Laterality: Right;  2 hrs RNFA   WISDOM TOOTH EXTRACTION      Medication: Current Facility-Administered Medications  Medication Dose Route Frequency Provider Last Rate Last Admin   0.9 %  sodium chloride  infusion  500 mL Intravenous Continuous Aneita Gwendlyn DASEN, MD       acetaminophen  (TYLENOL ) tablet 650 mg  650 mg Oral Q6H PRN Opyd, Timothy S, MD       Or   acetaminophen  (TYLENOL ) suppository 650 mg  650 mg Rectal Q6H PRN Opyd, Evalene  S, MD       atorvastatin  (LIPITOR) tablet 40 mg  40 mg Oral Daily Opyd, Timothy S, MD   40 mg at 11/14/23 1002   buPROPion (WELLBUTRIN XL) 24 hr tablet 300 mg  300 mg Oral QPC breakfast Opyd, Timothy S, MD   300 mg at 11/14/23 1002   Chlorhexidine Gluconate Cloth 2 % PADS 6 each  6 each Topical Daily Opyd, Timothy S, MD       fentaNYL  (SUBLIMAZE ) injection 12.5-50 mcg  12.5-50 mcg Intravenous Q2H PRN Opyd, Timothy S, MD   12.5 mcg at 11/14/23 1229   FLUoxetine (PROZAC) capsule 60 mg  60 mg Oral q morning Opyd, Timothy S, MD   60 mg at 11/14/23 1009   hyoscyamine (LEVSIN SL) SL tablet 0.125 mg   0.125 mg Sublingual Q4H PRN Delia Ole ORN, NP   0.125 mg at 11/14/23 1229   lactated ringers  infusion   Intravenous Continuous Opyd, Timothy S, MD 75 mL/hr at 11/14/23 1252 New Bag at 11/14/23 1252   oxyCODONE  (Oxy IR/ROXICODONE ) immediate release tablet 5 mg  5 mg Oral Q4H PRN Opyd, Timothy S, MD   5 mg at 11/14/23 0958   pantoprazole (PROTONIX) EC tablet 40 mg  40 mg Oral Daily Opyd, Timothy S, MD   40 mg at 11/14/23 1002   prochlorperazine (COMPAZINE) injection 5 mg  5 mg Intravenous Q6H PRN Opyd, Evalene RAMAN, MD       sodium chloride  flush (NS) 0.9 % injection 3 mL  3 mL Intravenous Q12H Opyd, Timothy S, MD   3 mL at 11/14/23 1241   sodium chloride  irrigation 0.9 % 3,000 mL  3,000 mL Irrigation Continuous Opyd, Timothy S, MD 0 mL/hr at 11/14/23 0617 3,000 mL at 11/14/23 1234   topiramate  (TOPAMAX ) tablet 100 mg  100 mg Oral QHS Opyd, Timothy S, MD       Current Outpatient Medications  Medication Sig Dispense Refill   amLODipine (NORVASC) 5 MG tablet Take 5 mg by mouth daily.      aspirin  EC 81 MG tablet Take 1 tablet (81 mg total) by mouth daily. Swallow whole.     atorvastatin  (LIPITOR) 40 MG tablet Take 1 tablet (40 mg total) by mouth daily. 90 tablet 3   buPROPion (WELLBUTRIN XL) 300 MG 24 hr tablet Take 300 mg by mouth daily after breakfast.      Calcium  Carb-Cholecalciferol (CALCIUM  + D3 PO) Take 1 tablet by mouth daily at 6 (six) AM.     Cholecalciferol (VITAMIN D3) 25 MCG (1000 UT) CAPS Take 1 capsule by mouth daily.     FLUoxetine (PROZAC) 20 MG capsule Take 60 mg by mouth every morning.     hydrOXYzine (ATARAX) 10 MG tablet Take 10-30 mg by mouth at bedtime as needed.     metoprolol  succinate (TOPROL  XL) 25 MG 24 hr tablet Take 1 tablet (25 mg total) by mouth daily. 90 tablet 3   Multiple Vitamin (MULTIVITAMIN WITH MINERALS) TABS tablet Take 1 tablet by mouth every evening.     omeprazole (PRILOSEC) 20 MG capsule Take 20 mg by mouth daily before breakfast.      topiramate   (TOPAMAX ) 100 MG tablet Take 1 tablet (100 mg total) by mouth at bedtime. 30 tablet 5   rizatriptan  (MAXALT ) 10 MG tablet Take 1 tablet (10 mg total) by mouth daily as needed. (Patient not taking: Reported on 11/14/2023) 10 tablet 5    Allergies: Allergies  Allergen Reactions   Elemental  Sulfur Nausea And Vomiting and Rash   Penicillins Rash    Social History: Social History   Tobacco Use   Smoking status: Never   Smokeless tobacco: Never  Vaping Use   Vaping status: Never Used  Substance Use Topics   Alcohol use: No   Drug use: No    Family History Family History  Problem Relation Age of Onset   Heart Problems Mother    Brain cancer Father    Colon cancer Neg Hx    Stomach cancer Neg Hx    Esophageal cancer Neg Hx    Pancreatic cancer Neg Hx    Rectal cancer Neg Hx     Review of Systems  Genitourinary:  Positive for hematuria. Negative for dysuria, flank pain, frequency and urgency.     Objective   Vital signs in last 24 hours: BP (!) 102/49   Pulse 65   Temp 97.9 F (36.6 C)   Resp 16   Ht 5' 5 (1.651 m)   Wt 62.6 kg   SpO2 100%   BMI 22.96 kg/m   Physical Exam General: A&O, resting, appropriate HEENT: [place]/AT Pulmonary: Normal work of breathing Cardiovascular: no cyanosis Abdomen: Soft, NTTP, nondistended GU: Three-way 55f Foley catheter in place.  CBI on low-medium gtt.  Light pink lemonade colored urine   Most Recent Labs: Lab Results  Component Value Date   WBC 14.2 (H) 11/13/2023   HGB 8.5 (L) 11/14/2023   HCT 25.0 (L) 11/14/2023   PLT 338 11/13/2023    Lab Results  Component Value Date   NA 141 11/14/2023   K 4.0 11/14/2023   CL 108 11/14/2023   CO2 19 (L) 11/13/2023   BUN 24 (H) 11/14/2023   CREATININE 1.30 (H) 11/14/2023   CALCIUM  9.4 11/13/2023    No results found for: INR, APTT   Urine Culture: @[id number] (laburin,org,r9620,[id number])@   IMAGING: US  Renal Result Date: 11/14/2023 EXAM: US  Retroperitoneum Complete,  Renal. CLINICAL HISTORY: eval for hematuria. TECHNIQUE: Real-time ultrasound of the retroperitoneum (complete) with image documentation. COMPARISON: CT of the Abdomen and Pelvis dated 11/11/2023. FINDINGS: RIGHT KIDNEY: The right kidney measures 8.7 x 4.3 x 3.7 cm with a volume of 73.3 cc. No hydronephrosis, renal stone, or mass visualized. LEFT KIDNEY: The left kidney measures 8.4 x 4.4 x 3.9 cm with a volume of 75.01 cc. No hydronephrosis, renal stone, or mass visualized. BLADDER: Partially decompressed urinary bladder is identified. Abnormal, asymmetric bladder wall thickening is again noted corresponding to a mass identified within the bladder on the recent CT. Echogenic debris noted within the lumen of the bladder. IMPRESSION: 1. Findings suspicious for a bladder mass with asymmetric bladder wall thickening and intraluminal echogenic debris, correlating with the recent CT-identified bladder mass. 2. No signs of hydronephrosis or kidney mass bilaterally. Electronically signed by: Waddell Calk MD 11/14/2023 05:57 AM EDT RP Workstation: [id number]    ------  Ole Bourdon, NP Pager: [phone]   Please contact the urology consult pager with any further questions/concerns.

## 2023-11-14 NOTE — ED Notes (Signed)
 Pt to [place].

## 2023-11-14 NOTE — Progress Notes (Signed)
 PROGRESS NOTE    [name]  FMW:[id number] DOB: 18-Sep-1950 DOA: 11/13/2023 PCP: Ransom Other, MD  Outpatient Specialists:     Brief Narrative:  Patient is a 73 year old female past medical history significant for coronary artery disease, CKD stage IIIa, GERD, hypertension and aortic insufficiency.  Patient was admitted with hematuria.  CT scan of abdomen and pelvis with and without contrast was done on 11/11/2023 revealed bladder mass with prominent left external iliac lymph node, without evidence of renal mass or hydronephrosis.  Urology team is directing care.  Patient had continuous bladder irrigation.  11/14/2023: Patient seen.  No new complaints.  Urology input is appreciated.  Further care will be as directed by the urology team.  Continue checking H&H every 8 hours.   Assessment & Plan:   Principal Problem:   Gross hematuria Active Problems:   CAD (coronary artery disease), native coronary artery   Bladder mass   Hypertension   Chronic headache   Anxiety   Depression   CKD stage 3a, GFR 45-59 ml/min (HCC)   1. Gross hematuria; bladder masses  - See above documentation. - Patient presented with worsening gross hematuria, intermittent difficulty voiding, and pain; CT from 11/11/23 is concerning for multifocal bladder malignancy with early nodal metastasis  - Urology is directing care.  Patient will eventually need TURBT. - Continue to monitor H/H.     2. CAD  - No anginal symptoms  - Continue to hold ASA, continue Lipitor    3. Hypertension  - Continue to monitor closely. - Low normal blood pressure noted.     4. CKD 3A/b  - Appears close to baseline - Repeat renal panel in the morning.     5. Depression, anxiety  - Continue Prozac and Wellbutrin     6. Chronic headaches  - Continue Topamax     DVT prophylaxis: SCD. Code Status: Full code Family Communication:  Disposition Plan: Patient is inpatient   Consultants:  Urology  Procedures:   Continuous bladder irrigation  Antimicrobials:  None   Subjective: Hematuria  Objective: Vitals:   11/14/23 0600 11/14/23 0900 11/14/23 0928 11/14/23 0936  BP: (!) 113/53 (!) 96/45 (!) 102/49   Pulse: 70 71 65   Resp: (!) 22 16 16    Temp:    97.9 F (36.6 C)  TempSrc:      SpO2: 97%  100%   Weight:      Height:       No intake or output data in the 24 hours ending 11/14/23 1132 Filed Weights   11/13/23 1922  Weight: 62.6 kg    Examination:  General exam: Appears calm and comfortable  Respiratory system: Clear to auscultation. Respiratory effort normal. Cardiovascular system: S1 & S2 heard Gastrointestinal system: Abdomen is soft and nontender.  Central nervous system: Alert and oriented.  Patient moves all extremities.  Data Reviewed: I have personally reviewed following labs and imaging studies  CBC: Recent Labs  Lab 11/13/23 2010 11/14/23 0405 11/14/23 0407  WBC 14.2*  --   --   NEUTROABS 9.7*  --   --   HGB 10.0* 8.5* 8.5*  HCT 31.5* 26.3* 25.0*  MCV 100.6*  --   --   PLT 338  --   --    Basic Metabolic Panel: Recent Labs  Lab 11/13/23 2010 11/14/23 0407  NA 141 141  K 3.8 4.0  CL 112* 108  CO2 19*  --   GLUCOSE 116* 105*  BUN 21 24*  CREATININE  1.35* 1.30*  CALCIUM  9.4  --    GFR: Estimated Creatinine Clearance: 33.6 mL/min (A) (by C-G formula based on SCr of 1.3 mg/dL (H)). Liver Function Tests: Recent Labs  Lab 11/13/23 2010  AST 58*  ALT 43  ALKPHOS 103  BILITOT 0.5  PROT 6.8  ALBUMIN 3.7   No results for input(s): LIPASE, AMYLASE in the last 168 hours. No results for input(s): AMMONIA in the last 168 hours. Coagulation Profile: No results for input(s): INR, PROTIME in the last 168 hours. Cardiac Enzymes: No results for input(s): CKTOTAL, CKMB, CKMBINDEX, TROPONINI in the last 168 hours. BNP (last 3 results) No results for input(s): PROBNP in the last 8760 hours. HbA1C: No results for input(s):  HGBA1C in the last 72 hours. CBG: No results for input(s): GLUCAP in the last 168 hours. Lipid Profile: No results for input(s): CHOL, HDL, LDLCALC, TRIG, CHOLHDL, LDLDIRECT in the last 72 hours. Thyroid  Function Tests: No results for input(s): TSH, T4TOTAL, FREET4, T3FREE, THYROIDAB in the last 72 hours. Anemia Panel: No results for input(s): VITAMINB12, FOLATE, FERRITIN, TIBC, IRON, RETICCTPCT in the last 72 hours. Urine analysis: No results found for: COLORURINE, APPEARANCEUR, LABSPEC, PHURINE, GLUCOSEU, HGBUR, BILIRUBINUR, KETONESUR, PROTEINUR, UROBILINOGEN, NITRITE, LEUKOCYTESUR Sepsis Labs: @LABRCNTIP (procalcitonin:4,lacticidven:4)  )No results found for this or any previous visit (from the past 240 hours).       Radiology Studies: US  Renal Result Date: 11/14/2023 EXAM: US  Retroperitoneum Complete, Renal. CLINICAL HISTORY: eval for hematuria. TECHNIQUE: Real-time ultrasound of the retroperitoneum (complete) with image documentation. COMPARISON: CT of the Abdomen and Pelvis dated 11/11/2023. FINDINGS: RIGHT KIDNEY: The right kidney measures 8.7 x 4.3 x 3.7 cm with a volume of 73.3 cc. No hydronephrosis, renal stone, or mass visualized. LEFT KIDNEY: The left kidney measures 8.4 x 4.4 x 3.9 cm with a volume of 75.01 cc. No hydronephrosis, renal stone, or mass visualized. BLADDER: Partially decompressed urinary bladder is identified. Abnormal, asymmetric bladder wall thickening is again noted corresponding to a mass identified within the bladder on the recent CT. Echogenic debris noted within the lumen of the bladder. IMPRESSION: 1. Findings suspicious for a bladder mass with asymmetric bladder wall thickening and intraluminal echogenic debris, correlating with the recent CT-identified bladder mass. 2. No signs of hydronephrosis or kidney mass bilaterally. Electronically signed by: Waddell Calk MD 11/14/2023 05:57 AM EDT RP  Workstation: [id number]        Scheduled Meds:  atorvastatin   40 mg Oral Daily   buPROPion  300 mg Oral QPC breakfast   Chlorhexidine Gluconate Cloth  6 each Topical Daily   FLUoxetine  60 mg Oral q morning   pantoprazole  40 mg Oral Daily   sodium chloride  flush  3 mL Intravenous Q12H   topiramate   100 mg Oral QHS   Continuous Infusions:  sodium chloride      lactated ringers      sodium chloride  irrigation Stopped (11/14/23 0617)     LOS: 0 days    Time spent: 35 minutes    Leatrice Chapel, MD  [facility] Pager #: [phone] 7PM-7AM contact night coverage as above

## 2023-11-14 NOTE — ED Provider Notes (Signed)
 [place] EMERGENCY DEPARTMENT AT [facility] Provider Note   CSN: [id number] Arrival date & time: 11/13/23  1842     Patient presents with: Vaginal Bleeding   [name] is a 73 y.o. female.   presents with vaginal bleeding that began approximately one week ago. She reports that the bleeding occurs primarily when she feels the urge to urinate, although today the bleeding has increased in severity. The patient has a recent diagnosis of bladder cancer, identified through a CT scan performed on Monday, which revealed two separate tumors in the urinary bladder. She denies any history of vaginal bleeding prior to this episode and has not undergone a hysterectomy. The patient was previously prescribed medication by her physician, suspecting kidney stones, which initially stopped the bleeding. However, the bleeding has since resumed and intensified. She reports frequent urination with blood present each time, and sometimes experiences the urge to urinate without passing urine. The patient has a history of anemia from many years ago and is currently taking an 81 mg aspirin  daily. She also has chronic kidney disease, which may contribute to lower hemoglobin levels. The history was obtained from the patient and her husband.   Vaginal Bleeding      Prior to Admission medications   Medication Sig Start Date End Date Taking? Authorizing Provider  amLODipine (NORVASC) 5 MG tablet Take 5 mg by mouth daily.  04/08/19   [provider]  aspirin  EC 81 MG tablet Take 1 tablet (81 mg total) by mouth daily. Swallow whole. 04/23/23   Shlomo Wilbert SAUNDERS, MD  atorvastatin  (LIPITOR) 40 MG tablet Take 1 tablet (40 mg total) by mouth daily. 07/24/23 10/22/23  Shlomo Wilbert SAUNDERS, MD  buPROPion (WELLBUTRIN XL) 300 MG 24 hr tablet Take 300 mg by mouth daily after breakfast.     [provider]  Calcium  Carb-Cholecalciferol (CALCIUM  + D3 PO) Take 1 tablet by mouth in the morning and at bedtime.     [provider]  Cholecalciferol (VITAMIN D3) 25 MCG (1000 UT) CAPS Take 1 capsule by mouth daily.    [provider]  FLUoxetine (PROZAC) 20 MG capsule Take 60 mg by mouth every morning.    [provider]  hydrOXYzine (ATARAX) 10 MG tablet Take 10-30 mg by mouth at bedtime as needed.    [provider]  metoprolol  succinate (TOPROL  XL) 25 MG 24 hr tablet Take 1 tablet (25 mg total) by mouth daily. 04/23/23   Shlomo Wilbert SAUNDERS, MD  metoprolol  tartrate (LOPRESSOR ) 100 MG tablet Take 1 tablet (100 mg total) by mouth as directed. Take one tablet 2 hours before your CT scan 03/24/23   Shlomo Wilbert SAUNDERS, MD  Multiple Vitamin (MULTIVITAMIN WITH MINERALS) TABS tablet Take 1 tablet by mouth every evening.    [provider]  omeprazole (PRILOSEC) 20 MG capsule Take 20 mg by mouth daily before breakfast.     [provider]  ondansetron  (ZOFRAN  ODT) 4 MG disintegrating tablet Take 1 tablet (4 mg total) by mouth every 8 (eight) hours as needed for nausea or vomiting. Patient not taking: Reported on 12/16/2022 04/27/19   Sharl Selinda Dover, MD  rizatriptan  (MAXALT ) 10 MG tablet Take 1 tablet (10 mg total) by mouth daily as needed. Patient taking differently: Take 10 mg by mouth every 2 (two) hours as needed for migraine. 08/21/12   Athar, Saima, MD  topiramate  (TOPAMAX ) 100 MG tablet Take 1 tablet (100 mg total) by mouth at bedtime. 08/21/12  Buck Saucer, MD    Allergies: Elemental sulfur    Review of Systems  Genitourinary:  Positive for vaginal bleeding.    Updated Vital Signs BP (!) 116/56   Pulse 67   Temp 97.7 F (36.5 C)   Resp 17   Ht 5' 5 (1.651 m)   Wt 62.6 kg   SpO2 100%   BMI 22.96 kg/m   Physical Exam Vitals and nursing note reviewed.  Constitutional:      Appearance: She is well-developed.  HENT:     Head: Normocephalic and atraumatic.  Cardiovascular:     Rate and Rhythm: Normal rate and regular rhythm.  Pulmonary:      Effort: No respiratory distress.     Breath sounds: No stridor.  Abdominal:     General: There is no distension.  Musculoskeletal:     Cervical back: Normal range of motion.  Neurological:     Mental Status: She is alert.     (all labs ordered are listed, but only abnormal results are displayed) Labs Reviewed  CBC WITH DIFFERENTIAL/PLATELET - Abnormal; Notable for the following components:      Result Value   WBC 14.2 (*)    RBC 3.13 (*)    Hemoglobin 10.0 (*)    HCT 31.5 (*)    MCV 100.6 (*)    Neutro Abs 9.7 (*)    Monocytes Absolute 1.9 (*)    All other components within normal limits  COMPREHENSIVE METABOLIC PANEL WITH GFR - Abnormal; Notable for the following components:   Chloride 112 (*)    CO2 19 (*)    Glucose, Bld 116 (*)    Creatinine, Ser 1.35 (*)    AST 58 (*)    GFR, Estimated 41 (*)    All other components within normal limits  URINALYSIS, ROUTINE W REFLEX MICROSCOPIC    EKG: None  Radiology: No results found.   .Critical Care  Performed by: Lorette Mayo, MD Authorized by: Lorette Mayo, MD   Critical care provider statement:    Critical care time (minutes):  30   Critical care was necessary to treat or prevent imminent or life-threatening deterioration of the following conditions:  Circulatory failure   Critical care was time spent personally by me on the following activities:  Development of treatment plan with patient or surrogate, discussions with consultants, evaluation of patient's response to treatment, examination of patient, ordering and review of laboratory studies, ordering and review of radiographic studies, ordering and performing treatments and interventions, pulse oximetry, re-evaluation of patient's condition and review of old charts    Medications Ordered in the ED  oxyCODONE -acetaminophen  (PERCOCET/ROXICET) 5-325 MG per tablet 1 tablet (1 tablet Oral Given 11/13/23 2245)  sodium chloride  irrigation 0.9 % 3,000 mL (has no  administration in time range)                                    Medical Decision Making Amount and/or Complexity of Data Reviewed Labs: ordered. Radiology: ordered.  Risk Prescription drug management. Decision regarding hospitalization.   The patient presented to the emergency department with vaginal bleeding, which has been ongoing for about a week and worsened today. The patient has a recent diagnosis of bladder cancer with two separate tumors identified on a CT scan performed on October 21st. The patient reports the bleeding occurs primarily when she feels the urge to urinate. She was previously prescribed medication,  possibly ciprofloxacin, for suspected kidney stones or UTI, which initially stopped the bleeding. The patient is not on any blood thinners but takes an 81 mg aspirin . Chronic kidney disease is noted in her chart, which may contribute to lower hemoglobin levels. A catheter will be placed to irrigate the bladder and assess the extent of bleeding. Admission will be considered based on the severity of bleeding and urology's recommendations.  Differential Diagnosis: Differential diagnosis includes but is not limited to: bladder cancer-related bleeding, urinary tract infection, kidney stones, gynecological bleeding, and anemia secondary to chronic kidney disease.  Diagnostics Review  Laboratory Interpretation (Interpreted by me): Hemoglobin is lower compared to 2021 levels. Bicarbonate is slightly low. Kidney function remains consistent with chronic kidney disease.  Imaging Interpretation (Interpreted by me): CT scan from October 21st shows two separate tumors in the bladder.  Historians other than the patient: Husband provided additional context regarding the patient's symptoms and medical history.  External Records Reviewed: CT scan report from October 21st showing bladder tumors.  Care significantly affected by the following chronic Conditions: Chronic kidney disease may  contribute to anemia and lower hemoglobin levels, affecting the patient's current presentation and management.  Reevaluation: Discussed with urology who recommended repeat hemoglobin and ultrasound and if abnormal to call back.  Repeat hemoglobin 8.5.  Patient was consented for blood and typed and screened but is not symptomatic so we will not transfuse at this point.  Discussed with urology again (Dr. Nieves) he agrees with admission and wants to keep her n.p.o. and he will see her later in the morning for further management.  Patient stable at time of admission.   Final diagnoses:  None    ED Discharge Orders     None          Dearra Myhand, Selinda, MD 11/14/23 [phone]

## 2023-11-14 NOTE — ED Notes (Signed)
 MD at bedside.

## 2023-11-15 ENCOUNTER — Inpatient Hospital Stay (HOSPITAL_COMMUNITY)

## 2023-11-15 DIAGNOSIS — R319 Hematuria, unspecified: Secondary | ICD-10-CM | POA: Diagnosis not present

## 2023-11-15 DIAGNOSIS — R310 Gross hematuria: Secondary | ICD-10-CM | POA: Diagnosis not present

## 2023-11-15 LAB — BASIC METABOLIC PANEL WITH GFR
Anion gap: 9 (ref 5–15)
BUN: 15 mg/dL (ref 8–23)
CO2: 21 mmol/L — ABNORMAL LOW (ref 22–32)
Calcium: 8 mg/dL — ABNORMAL LOW (ref 8.9–10.3)
Chloride: 107 mmol/L (ref 98–111)
Creatinine, Ser: 1.13 mg/dL — ABNORMAL HIGH (ref 0.44–1.00)
GFR, Estimated: 51 mL/min — ABNORMAL LOW (ref >60)
Glucose, Bld: 104 mg/dL — ABNORMAL HIGH (ref 70–99)
Potassium: 3.6 mmol/L (ref 3.5–5.1)
Sodium: 137 mmol/L (ref 135–145)

## 2023-11-15 LAB — CBC
HCT: 23.7 % — ABNORMAL LOW (ref 36.0–46.0)
HCT: 24.5 % — ABNORMAL LOW (ref 36.0–46.0)
Hemoglobin: 7.8 g/dL — ABNORMAL LOW (ref 12.0–15.0)
Hemoglobin: 8 g/dL — ABNORMAL LOW (ref 12.0–15.0)
MCH: 32.5 pg (ref 26.0–34.0)
MCH: 32.8 pg (ref 26.0–34.0)
MCHC: 32.9 g/dL (ref 30.0–36.0)
MCHC: 32.7 g/dL (ref 30.0–36.0)
MCV: 98.8 fL (ref 80.0–100.0)
MCV: 100.4 fL — ABNORMAL HIGH (ref 80.0–100.0)
Platelets: 251 K/uL (ref 150–400)
Platelets: 254 K/uL (ref 150–400)
RBC: 2.4 MIL/uL — ABNORMAL LOW (ref 3.87–5.11)
RBC: 2.44 MIL/uL — ABNORMAL LOW (ref 3.87–5.11)
RDW: 12.9 % (ref 11.5–15.5)
RDW: 13.1 % (ref 11.5–15.5)
WBC: 10.6 K/uL — ABNORMAL HIGH (ref 4.0–10.5)
WBC: 12.6 K/uL — ABNORMAL HIGH (ref 4.0–10.5)
nRBC: 0 % (ref 0.0–0.2)
nRBC: 0 % (ref 0.0–0.2)

## 2023-11-15 MED ORDER — TRANEXAMIC ACID 650 MG PO TABS
1300.0000 mg | ORAL_TABLET | Freq: Once | ORAL | Status: AC
  Administered 2023-11-15: 1300 mg via ORAL
  Filled 2023-11-15: qty 2

## 2023-11-15 MED ORDER — ALUM & MAG HYDROXIDE-SIMETH 200-200-20 MG/5ML PO SUSP
15.0000 mL | Freq: Four times a day (QID) | ORAL | Status: DC | PRN
  Administered 2023-11-15 – 2023-11-17 (×3): 15 mL via ORAL
  Filled 2023-11-15 (×4): qty 30

## 2023-11-15 NOTE — Progress Notes (Signed)
 Two nurses attempted to exchange Foley catheter unsuccessfully. Provider notified and awaiting further instructions.

## 2023-11-15 NOTE — Procedures (Signed)
 Called to assist with foley placement. Patient with 18 fr foley initially and reccommended upsize to 22 hematuria foley. Nursing staff reportedly tried x 2  Patient was prepped and draped in the usual sterile fashion. 22 fr 3 way foley inserted and balloon filled with 10 cc. I hand irrigated out ~40-50 cc of small clots. Foley connected to drainage bag. Thereafter foley was running clear/pink on low rate CBI.   Based on appearance of urine, I think ok for patient to have dinner; will make NPO at midnight in case of procedure tomorrow

## 2023-11-15 NOTE — Progress Notes (Signed)
 [facility] called for transport to [place] and should not be long.

## 2023-11-15 NOTE — Progress Notes (Signed)
 This nurse gave report to [facility] at [employer].

## 2023-11-15 NOTE — Progress Notes (Signed)
 PROGRESS NOTE    [name]  FMW:[id number] DOB: 09-02-1950 DOA: 11/13/2023 PCP: Ransom Other, [name]  Outpatient Specialists:     Brief Narrative:  Patient is a 73 year old female past medical history significant for coronary artery disease, CKD stage IIIa, GERD, hypertension and aortic insufficiency.  Patient was admitted with hematuria.  CT scan of abdomen and pelvis with and without contrast was done on 11/11/2023 revealed bladder mass with prominent left external iliac lymph node, without evidence of renal mass or hydronephrosis.  Urology team is directing care.  Patient had continuous bladder irrigation.  11/14/2023: Patient seen.  No new complaints.  Urology input is appreciated.  Further care will be as directed by the urology team.  Continue checking H&H every 8 hours.  11/15/2023: Patient seen alongside patient's nurse.  No new complaints.  Hematuria is clearing.  Urology team has asked for patient to be transferred to [facility].  Keep n.p.o. after midnight.  Apparently, did not nursing staff could not insert a bigger three-way Foley catheter.   Assessment & Plan:   Principal Problem:   Gross hematuria Active Problems:   CAD (coronary artery disease), native coronary artery   Bladder mass   Hypertension   Chronic headache   Anxiety   Depression   CKD stage 3a, GFR 45-59 ml/min (HCC)   1. Gross hematuria; bladder masses  - See above documentation. - Patient presented with worsening gross hematuria, intermittent difficulty voiding, and pain; CT from 11/11/23 is concerning for multifocal bladder malignancy with early nodal metastasis  - Urology is directing care.  Patient will eventually need TURBT. - Continue to monitor H/H.   11/15/2023: Hemoglobin is stable (7.8 g/dL).  See above documentation.  Patient be transferred to the [facility] long hospital.  Please keep patient n.p.o. after midnight.   2. CAD  - No anginal symptoms  - Continue to hold ASA,  continue Lipitor    3. Hypertension  - Continue to monitor closely. - Low normal blood pressure noted.     4. CKD 3A/b  - Appears close to baseline - Repeat renal panel in the morning. 11/15/2023: BMP done earlier today revealed sodium of 137, potassium of 3.6, chloride 107, CO2 of 21, BUN of 15 and serum creatinine of 1.13.   5. Depression, anxiety  - Continue Prozac and Wellbutrin     6. Chronic headaches  - Continue Topamax     DVT prophylaxis: SCD. Code Status: Full code Family Communication:  Disposition Plan: Patient is inpatient   Consultants:  Urology  Procedures:  Continuous bladder irrigation  Antimicrobials:  None   Subjective: Hematuria is clearing  Objective: Vitals:   11/14/23 2043 11/15/23 0439 11/15/23 0944 11/15/23 1622  BP: (!) 119/48 (!) 116/55 (!) 113/57 (!) 119/54  Pulse: 76 67 76 77  Resp: 18 18 16 19   Temp: 98.3 F (36.8 C) 98.5 F (36.9 C) 98.1 F (36.7 C) 98.5 F (36.9 C)  TempSrc:      SpO2: 94% 95% 97% 95%  Weight:      Height:        Intake/Output Summary (Last 24 hours) at 11/15/2023 1705 Last data filed at 11/15/2023 1404 Gross per 24 hour  Intake 533.68 ml  Output 4150 ml  Net -3616.32 ml   Filed Weights   11/13/23 1922  Weight: 62.6 kg    Examination:  General exam: Appears calm and comfortable  Respiratory system: Clear to auscultation. Respiratory effort normal. Cardiovascular system: S1 & S2 heard Gastrointestinal  system: Abdomen is soft and nontender.  Central nervous system: Alert and oriented.  Patient moves all extremities.  Data Reviewed: I have personally reviewed following labs and imaging studies  CBC: Recent Labs  Lab 11/13/23 2010 11/14/23 0405 11/14/23 0407 11/14/23 1300 11/14/23 1918 11/15/23 0501 11/15/23 1154  WBC 14.2*  --   --  10.7* 11.9* 10.6* 12.6*  NEUTROABS 9.7*  --   --   --   --   --   --   HGB 10.0*   < > 8.5* 8.0* 8.2* 7.8* 8.0*  HCT 31.5*   < > 25.0* 24.0* 25.0* 23.7*  24.5*  MCV 100.6*  --   --  98.8 98.8 98.8 100.4*  PLT 338  --   --  261 265 251 254   < > = values in this interval not displayed.   Basic Metabolic Panel: Recent Labs  Lab 11/13/23 2010 11/14/23 0407 11/15/23 0501  NA 141 141 137  K 3.8 4.0 3.6  CL 112* 108 107  CO2 19*  --  21*  GLUCOSE 116* 105* 104*  BUN 21 24* 15  CREATININE 1.35* 1.30* 1.13*  CALCIUM  9.4  --  8.0*   GFR: Estimated Creatinine Clearance: 38.7 mL/min (A) (by C-G formula based on SCr of 1.13 mg/dL (H)). Liver Function Tests: Recent Labs  Lab 11/13/23 2010  AST 58*  ALT 43  ALKPHOS 103  BILITOT 0.5  PROT 6.8  ALBUMIN 3.7   No results for input(s): LIPASE, AMYLASE in the last 168 hours. No results for input(s): AMMONIA in the last 168 hours. Coagulation Profile: No results for input(s): INR, PROTIME in the last 168 hours. Cardiac Enzymes: No results for input(s): CKTOTAL, CKMB, CKMBINDEX, TROPONINI in the last 168 hours. BNP (last 3 results) No results for input(s): PROBNP in the last 8760 hours. HbA1C: No results for input(s): HGBA1C in the last 72 hours. CBG: No results for input(s): GLUCAP in the last 168 hours. Lipid Profile: No results for input(s): CHOL, HDL, LDLCALC, TRIG, CHOLHDL, LDLDIRECT in the last 72 hours. Thyroid  Function Tests: No results for input(s): TSH, T4TOTAL, FREET4, T3FREE, THYROIDAB in the last 72 hours. Anemia Panel: No results for input(s): VITAMINB12, FOLATE, FERRITIN, TIBC, IRON, RETICCTPCT in the last 72 hours. Urine analysis: No results found for: COLORURINE, APPEARANCEUR, LABSPEC, PHURINE, GLUCOSEU, HGBUR, BILIRUBINUR, KETONESUR, PROTEINUR, UROBILINOGEN, NITRITE, LEUKOCYTESUR Sepsis Labs: @LABRCNTIP (procalcitonin:4,lacticidven:4)  )No results found for this or any previous visit (from the past 240 hours).       Radiology Studies: US  Renal Result Date: 11/14/2023 EXAM: US   Retroperitoneum Complete, Renal. CLINICAL HISTORY: eval for hematuria. TECHNIQUE: Real-time ultrasound of the retroperitoneum (complete) with image documentation. COMPARISON: CT of the Abdomen and Pelvis dated 11/11/2023. FINDINGS: RIGHT KIDNEY: The right kidney measures 8.7 x 4.3 x 3.7 cm with a volume of 73.3 cc. No hydronephrosis, renal stone, or mass visualized. LEFT KIDNEY: The left kidney measures 8.4 x 4.4 x 3.9 cm with a volume of 75.01 cc. No hydronephrosis, renal stone, or mass visualized. BLADDER: Partially decompressed urinary bladder is identified. Abnormal, asymmetric bladder wall thickening is again noted corresponding to a mass identified within the bladder on the recent CT. Echogenic debris noted within the lumen of the bladder. IMPRESSION: 1. Findings suspicious for a bladder mass with asymmetric bladder wall thickening and intraluminal echogenic debris, correlating with the recent CT-identified bladder mass. 2. No signs of hydronephrosis or kidney mass bilaterally. Electronically signed by: Waddell Calk [name] 11/14/2023 05:57 AM EDT RP Workstation:  [id number]        Scheduled Meds:  atorvastatin   40 mg Oral Daily   buPROPion  300 mg Oral QPC breakfast   Chlorhexidine Gluconate Cloth  6 each Topical Daily   famotidine  20 mg Oral Daily   FLUoxetine  60 mg Oral q morning   pantoprazole  40 mg Oral Daily   sodium chloride  flush  3 mL Intravenous Q12H   topiramate   100 mg Oral QHS   Continuous Infusions:  sodium chloride  irrigation 0 mL (11/14/23 0617)     LOS: 1 day    Time spent: 35 minutes    Alice Chapel, [name]  [facility] Pager #: [phone] 7PM-7AM contact night coverage as above

## 2023-11-15 NOTE — Progress Notes (Addendum)
  Subjective: No acute events overnight. Blood counts stable this AM  At time of my exam, foley not draining and CBI off, there was scant bloody urine in bag  Objective: Vital signs in last 24 hours: Temp:  [97.8 F (36.6 C)-98.6 F (37 C)] 98.5 F (36.9 C) (10/25 0439) Pulse Rate:  [65-92] 67 (10/25 0439) Resp:  [16-20] 18 (10/25 0439) BP: (98-131)/(45-67) 116/55 (10/25 0439) SpO2:  [94 %-100 %] 95 % (10/25 0439)  Intake/Output from previous day: 10/24 0701 - 10/25 0700 In: 533.7 [I.V.:533.7] Out: 1150 [Urine:1150] Intake/Output this shift: No intake/output data recorded.  Physical Exam:  General: Alert and oriented, some distress CV: RRR Lungs: Clear Abdomen: Soft, ND, ATTP Ext: NT, No erythema  Lab Results: Recent Labs    11/14/23 1300 11/14/23 1918 11/15/23 0501  HGB 8.0* 8.2* 7.8*  HCT 24.0* 25.0* 23.7*   BMET Recent Labs    11/13/23 2010 11/14/23 0407 11/15/23 0501  NA 141 141 137  K 3.8 4.0 3.6  CL 112* 108 107  CO2 19*  --  21*  GLUCOSE 116* 105* 104*  BUN 21 24* 15  CREATININE 1.35* 1.30* 1.13*  CALCIUM  9.4  --  8.0*     Studies/Results: US  Renal Result Date: 11/14/2023 EXAM: US  Retroperitoneum Complete, Renal. CLINICAL HISTORY: eval for hematuria. TECHNIQUE: Real-time ultrasound of the retroperitoneum (complete) with image documentation. COMPARISON: CT of the Abdomen and Pelvis dated 11/11/2023. FINDINGS: RIGHT KIDNEY: The right kidney measures 8.7 x 4.3 x 3.7 cm with a volume of 73.3 cc. No hydronephrosis, renal stone, or mass visualized. LEFT KIDNEY: The left kidney measures 8.4 x 4.4 x 3.9 cm with a volume of 75.01 cc. No hydronephrosis, renal stone, or mass visualized. BLADDER: Partially decompressed urinary bladder is identified. Abnormal, asymmetric bladder wall thickening is again noted corresponding to a mass identified within the bladder on the recent CT. Echogenic debris noted within the lumen of the bladder. IMPRESSION: 1. Findings  suspicious for a bladder mass with asymmetric bladder wall thickening and intraluminal echogenic debris, correlating with the recent CT-identified bladder mass. 2. No signs of hydronephrosis or kidney mass bilaterally. Electronically signed by: Waddell Calk MD 11/14/2023 05:57 AM EDT RP Workstation: [id number]    Assessment/Plan: -gross hematuria, clot retention, multiple bladder masses  --hand irrigated out some clots and dark urine at bedside; poorly tolerated but was able to get catheter draining clear on low/med rate CBI --recommend exchanging catheter for larger foley ASAP --will give another dose of TXA today; hopeful conservative measures work but if not would proceed with clot evac/fulguration in OR.  --pelvic US  ordered tomorrow to assess residual clot burden   LOS: 1 day   Herlene Foot MD 11/15/2023, 9:20 AM [facility]  Pager: [phone]

## 2023-11-15 NOTE — Plan of Care (Signed)
  Problem: Clinical Measurements: °Goal: Ability to maintain clinical measurements within normal limits will improve °Outcome: Progressing °Goal: Will remain free from infection °Outcome: Progressing °  °Problem: Elimination: °Goal: Will not experience complications related to bowel motility °Outcome: Progressing °  °

## 2023-11-16 DIAGNOSIS — N1831 Chronic kidney disease, stage 3a: Secondary | ICD-10-CM

## 2023-11-16 DIAGNOSIS — R310 Gross hematuria: Secondary | ICD-10-CM | POA: Diagnosis not present

## 2023-11-16 DIAGNOSIS — N3289 Other specified disorders of bladder: Secondary | ICD-10-CM | POA: Diagnosis not present

## 2023-11-16 DIAGNOSIS — I2510 Atherosclerotic heart disease of native coronary artery without angina pectoris: Secondary | ICD-10-CM | POA: Diagnosis not present

## 2023-11-16 LAB — CBC
HCT: 25.2 % — ABNORMAL LOW (ref 36.0–46.0)
Hemoglobin: 7.5 g/dL — ABNORMAL LOW (ref 12.0–15.0)
MCH: 31.1 pg (ref 26.0–34.0)
MCHC: 29.8 g/dL — ABNORMAL LOW (ref 30.0–36.0)
MCV: 104.6 fL — ABNORMAL HIGH (ref 80.0–100.0)
Platelets: 252 K/uL (ref 150–400)
RBC: 2.41 MIL/uL — ABNORMAL LOW (ref 3.87–5.11)
RDW: 13.1 % (ref 11.5–15.5)
WBC: 10.5 K/uL (ref 4.0–10.5)
nRBC: 0 % (ref 0.0–0.2)

## 2023-11-16 LAB — BASIC METABOLIC PANEL WITH GFR
Anion gap: 10 (ref 5–15)
BUN: 11 mg/dL (ref 8–23)
CO2: 21 mmol/L — ABNORMAL LOW (ref 22–32)
Calcium: 8.3 mg/dL — ABNORMAL LOW (ref 8.9–10.3)
Chloride: 109 mmol/L (ref 98–111)
Creatinine, Ser: 1.16 mg/dL — ABNORMAL HIGH (ref 0.44–1.00)
GFR, Estimated: 49 mL/min — ABNORMAL LOW (ref >60)
Glucose, Bld: 101 mg/dL — ABNORMAL HIGH (ref 70–99)
Potassium: 3.5 mmol/L (ref 3.5–5.1)
Sodium: 140 mmol/L (ref 135–145)

## 2023-11-16 LAB — PREPARE RBC (CROSSMATCH)

## 2023-11-16 MED ORDER — SENNOSIDES-DOCUSATE SODIUM 8.6-50 MG PO TABS
1.0000 | ORAL_TABLET | Freq: Two times a day (BID) | ORAL | Status: DC
  Administered 2023-11-16 – 2023-11-18 (×5): 1 via ORAL
  Filled 2023-11-16 (×5): qty 1

## 2023-11-16 MED ORDER — SODIUM CHLORIDE 0.9% IV SOLUTION: Freq: Once | INTRAVENOUS | Status: AC

## 2023-11-16 NOTE — Progress Notes (Signed)
 [facility]                                                                              [name], is a 73 y.o. female, DOB - May 16, 1950, FMW:[id number] Admit date - 11/13/2023    Outpatient Primary MD for the patient is Husain, Karrar, MD  LOS - 2  days  Chief Complaint  Patient presents with   Vaginal Bleeding       Brief summary   Patient is a 73 year old female past medical history significant for coronary artery disease, CKD stage IIIa, GERD, hypertension and aortic insufficiency.  Patient was admitted with hematuria.  CT scan of abdomen and pelvis with and without contrast was done on 11/11/2023 revealed bladder mass with prominent left external iliac lymph node, without evidence of renal mass or hydronephrosis.  Urology was consulted, started on CBI.   Received TXA, patient was transferred to [facility] for possible clot evacuation/fulguration in the OR     Assessment & Plan      Gross hematuria, clot retention with multiple bladder masses - Urology following, CBI was started, catheter was exchanged for a larger Foley on 10/25 - Received TXA, patient was transferred to [facility] for possibility of clot evacuation and fulguration in the OR - Pelvic ultrasound showed mildly distended bladder with Foley catheter, echogenic material suggestive of clot or debris - Seen by urology today, Dr. Lovie, urine clear today, hold CBI, okay to restart if indicated.  Recommended no need for today and if it stays clear, could try to remove Foley tomorrow. - Placed back on diet   CAD  - No anginal symptoms  - Continue to hold ASA, continue Lipitor     Hypertension  - BP soft but stable.    CKD stage IIIa - Appears close to baseline   Depression, anxiety  - Continue Prozac and Wellbutrin     Chronic headaches  - Continue Topamax     Estimated body mass index is 23.15 kg/m as calculated from the following:   Height as of this encounter: 5' 5  (1.651 m).   Weight as of this encounter: 63.1 kg.  Code Status: Full code DVT Prophylaxis:  SCDs Start: 11/14/23 0603   Level of Care: Level of care: Med-Surg Family Communication: Updated patient Disposition Plan:      Remains inpatient appropriate: Possible DC home tomorrow   Procedures:  None  Consultants:   Oncology  Antimicrobials:   Anti-infectives (From admission, onward)    None          Medications  atorvastatin   40 mg Oral Daily   buPROPion  300 mg Oral QPC breakfast   Chlorhexidine Gluconate Cloth  6 each Topical Daily   famotidine  20 mg Oral Daily   FLUoxetine  60 mg Oral q morning   pantoprazole  40 mg Oral Daily   sodium chloride  flush  3 mL Intravenous Q12H   topiramate   100 mg Oral QHS      Subjective:   [name] was seen and examined today.  No acute complaints.  States was seen by urology today and no need  for the OR.  No pain, hematuria cleared.    Objective:   Vitals:   11/15/23 2046 11/15/23 2053 11/16/23 0053 11/16/23 0519  BP: 129/62  (!) 106/50 (!) 118/56  Pulse: 86  78 75  Resp:   (!) 24   Temp: 98.4 F (36.9 C)  97.9 F (36.6 C) 98.1 F (36.7 C)  TempSrc: Oral  Oral Oral  SpO2: 97%  97% 95%  Weight:  63.1 kg    Height:  5' 5 (1.651 m)      Intake/Output Summary (Last 24 hours) at 11/16/2023 1100 Last data filed at 11/16/2023 0547 Gross per 24 hour  Intake 643 ml  Output 84449 ml  Net -14907 ml     Wt Readings from Last 3 Encounters:  11/15/23 63.1 kg  06/02/23 63.1 kg  03/24/23 63.7 kg     Exam General: Alert and oriented x 3, NAD Cardiovascular: S1 S2 auscultated,  RRR Respiratory: Clear to auscultation bilaterally, no wheezing Gastrointestinal: Soft, nontender, nondistended, + bowel sounds Ext: no pedal edema bilaterally Neuro: No new deficits Psych: Normal affect  GU: Foley+, clear urine    Data Reviewed:  I have personally reviewed following labs    CBC Lab Results  Component Value  Date   WBC 10.5 11/16/2023   RBC 2.41 (L) 11/16/2023   HGB 7.5 (L) 11/16/2023   HCT 25.2 (L) 11/16/2023   MCV 104.6 (H) 11/16/2023   MCH 31.1 11/16/2023   PLT 252 11/16/2023   MCHC 29.8 (L) 11/16/2023   RDW 13.1 11/16/2023   LYMPHSABS 2.3 11/13/2023   MONOABS 1.9 (H) 11/13/2023   EOSABS 0.1 11/13/2023   BASOSABS 0.1 11/13/2023     Last metabolic panel Lab Results  Component Value Date   NA 140 11/16/2023   K 3.5 11/16/2023   CL 109 11/16/2023   CO2 21 (L) 11/16/2023   BUN 11 11/16/2023   CREATININE 1.16 (H) 11/16/2023   GLUCOSE 101 (H) 11/16/2023   GFRNONAA 49 (L) 11/16/2023   GFRAA >60 04/27/2019   CALCIUM  8.3 (L) 11/16/2023   PROT 6.8 11/13/2023   ALBUMIN 3.7 11/13/2023   BILITOT 0.5 11/13/2023   ALKPHOS 103 11/13/2023   AST 58 (H) 11/13/2023   ALT 43 11/13/2023   ANIONGAP 10 11/16/2023    CBG (last 3)  No results for input(s): GLUCAP in the last 72 hours.    Coagulation Profile: No results for input(s): INR, PROTIME in the last 168 hours.   Radiology Studies: I have personally reviewed the imaging studies  US  PELVIS LIMITED (TRANSABDOMINAL ONLY) Result Date: 11/15/2023 EXAM: US  Retroperitoneum Limited, Bladder 11/15/2023 12:33:00 PM TECHNIQUE: Real-time ultrasonography of the retroperitoneum, specifically the urinary bladder, was performed. COMPARISON: None available. CLINICAL HISTORY: Hematuria. FINDINGS: BLADDER: Foley catheter within the bladder. The bladder is mildly distended about the catheter. echogenic material layers within the bladder. IMPRESSION: 1. Mildly distended bladder with Foley catheter. echogenic material suggestive of clot or debris . Electronically signed by: Norleen Boxer MD 11/15/2023 05:42 PM EDT RP Workstation: [id number]       [name] M.D. [facility] 11/16/2023, 11:00 AM  Available via Epic secure chat 7am-7pm After 7 pm, please refer to night coverage provider listed on amion.

## 2023-11-16 NOTE — Progress Notes (Signed)
  Subjective: No acute events overnight. Foley upsized yesterday. Pelvic ultrasound yesterday - no substantial clot burden  Urine clear on low rate CBI today    Objective: Vital signs in last 24 hours: Temp:  [97.9 F (36.6 C)-98.5 F (36.9 C)] 98.1 F (36.7 C) (10/26 0519) Pulse Rate:  [75-86] 75 (10/26 0519) Resp:  [16-24] 24 (10/26 0053) BP: (106-129)/(50-62) 118/56 (10/26 0519) SpO2:  [95 %-97 %] 95 % (10/26 0519) Weight:  [63.1 kg] 63.1 kg (10/25 2053)  Intake/Output from previous day: 10/25 0701 - 10/26 0700 In: 643 [P.O.:640; I.V.:3] Out: 83449 [Urine:16550] Intake/Output this shift: Total I/O In: 243 [P.O.:240; I.V.:3] Out: 88449 [Urine:11550]  Physical Exam:  General: Alert and oriented, some distress CV: RRR Lungs: Clear Abdomen: Soft, ND, ATTP Ext: NT, No erythema  Lab Results: Recent Labs    11/14/23 1918 11/15/23 0501 11/15/23 1154  HGB 8.2* 7.8* 8.0*  HCT 25.0* 23.7* 24.5*   BMET Recent Labs    11/13/23 2010 11/14/23 0407 11/15/23 0501  NA 141 141 137  K 3.8 4.0 3.6  CL 112* 108 107  CO2 19*  --  21*  GLUCOSE 116* 105* 104*  BUN 21 24* 15  CREATININE 1.35* 1.30* 1.13*  CALCIUM  9.4  --  8.0*     Studies/Results: US  PELVIS LIMITED (TRANSABDOMINAL ONLY) Result Date: 11/15/2023 EXAM: US  Retroperitoneum Limited, Bladder 11/15/2023 12:33:00 PM TECHNIQUE: Real-time ultrasonography of the retroperitoneum, specifically the urinary bladder, was performed. COMPARISON: None available. CLINICAL HISTORY: Hematuria. FINDINGS: BLADDER: Foley catheter within the bladder. The bladder is mildly distended about the catheter. echogenic material layers within the bladder. IMPRESSION: 1. Mildly distended bladder with Foley catheter. echogenic material suggestive of clot or debris . Electronically signed by: Norleen Boxer MD 11/15/2023 05:42 PM EDT RP Workstation: [id number]    Assessment/Plan: -gross hematuria, clot retention, multiple bladder  masses  -urine looks good this AM. Will hold CBI; ok to restart if indicated. If stays clear could try to remove foley tomorrow AM.  -ok for patient to have diet    LOS: 2 days   Herlene Foot MD 11/16/2023, 6:24 AM [facility]  Pager: [phone]

## 2023-11-17 DIAGNOSIS — F419 Anxiety disorder, unspecified: Secondary | ICD-10-CM | POA: Diagnosis not present

## 2023-11-17 DIAGNOSIS — N3289 Other specified disorders of bladder: Secondary | ICD-10-CM | POA: Diagnosis not present

## 2023-11-17 DIAGNOSIS — R310 Gross hematuria: Secondary | ICD-10-CM | POA: Diagnosis not present

## 2023-11-17 LAB — TYPE AND SCREEN
ABO/RH(D): O POS
Antibody Screen: NEGATIVE
Unit division: 0

## 2023-11-17 LAB — CBC
HCT: 28.5 % — ABNORMAL LOW (ref 36.0–46.0)
Hemoglobin: 8.9 g/dL — ABNORMAL LOW (ref 12.0–15.0)
MCH: 31.2 pg (ref 26.0–34.0)
MCHC: 31.2 g/dL (ref 30.0–36.0)
MCV: 100 fL (ref 80.0–100.0)
Platelets: 240 K/uL (ref 150–400)
RBC: 2.85 MIL/uL — ABNORMAL LOW (ref 3.87–5.11)
RDW: 14.8 % (ref 11.5–15.5)
WBC: 11.5 K/uL — ABNORMAL HIGH (ref 4.0–10.5)
nRBC: 0 % (ref 0.0–0.2)

## 2023-11-17 LAB — BASIC METABOLIC PANEL WITH GFR
Anion gap: 8 (ref 5–15)
BUN: 9 mg/dL (ref 8–23)
CO2: 21 mmol/L — ABNORMAL LOW (ref 22–32)
Calcium: 8.1 mg/dL — ABNORMAL LOW (ref 8.9–10.3)
Chloride: 111 mmol/L (ref 98–111)
Creatinine, Ser: 1 mg/dL (ref 0.44–1.00)
GFR, Estimated: 58 mL/min — ABNORMAL LOW (ref >60)
Glucose, Bld: 106 mg/dL — ABNORMAL HIGH (ref 70–99)
Potassium: 3.7 mmol/L (ref 3.5–5.1)
Sodium: 140 mmol/L (ref 135–145)

## 2023-11-17 LAB — BPAM RBC
Blood Product Expiration Date: 202511242359
ISSUE DATE / TIME: 202510262020
Unit Type and Rh: 5100

## 2023-11-17 NOTE — Progress Notes (Addendum)
 [facility]                                                                              [name], is a 73 y.o. female, DOB - 1950-07-06, FMW:[id number] Admit date - 11/13/2023    Outpatient Primary MD for the patient is Husain, Karrar, MD  LOS - 3  days  Chief Complaint  Patient presents with   Vaginal Bleeding       Brief summary   Patient is a 73 year old female past medical history significant for coronary artery disease, CKD stage IIIa, GERD, hypertension and aortic insufficiency.  Patient was admitted with hematuria.  CT scan of abdomen and pelvis with and without contrast was done on 11/11/2023 revealed bladder mass with prominent left external iliac lymph node, without evidence of renal mass or hydronephrosis.  Urology was consulted, started on CBI.   Received TXA, patient was transferred to [facility] for possible clot evacuation/fulguration in the OR     Assessment & Plan      Gross hematuria, clot retention with multiple bladder masses - Urology following, CBI was started, catheter was exchanged for a larger Foley on 10/25 - Received TXA, patient was transferred to [facility] for possibility of clot evacuation and fulguration in the OR - Pelvic ultrasound showed mildly distended bladder with Foley catheter, echogenic material suggestive of clot or debris - Closely followed by urology, hematuria cleared, and did not need any surgery.  - Per urology plan to turn off CBI and if still clear midday will remove Foley and voiding trial.   - Outpatient follow-up with Dr. Renda on November 4 at 1 PM  Addendum: 2:33pm Foley removed, voided with clots Discussed with urology, hopefully these are old clots that are settled at the bottom of the bladder however to be safe, recommended H&H and string of bottles at bedside.  Urology will follow in a.m. to make sure hematuria does not recur.  DC home in a.m if no further hematuria.   CAD  - No anginal  symptoms  - Continue to hold ASA, continue Lipitor     Hypertension  - BP stable  CKD stage IIIa - Appears close to baseline   Depression, anxiety  - Continue Prozac and Wellbutrin     Chronic headaches  - Continue Topamax     Estimated body mass index is 23.15 kg/m as calculated from the following:   Height as of this encounter: 5' 5 (1.651 m).   Weight as of this encounter: 63.1 kg.  Code Status: Full code DVT Prophylaxis:  SCDs Start: 11/14/23 0603   Level of Care: Level of care: Med-Surg Family Communication: Updated patient's husband at the bedside Disposition Plan:      Remains inpatient appropriate: Possible DC home today if successfully voids after Foley catheter removal   Procedures:  None  Consultants:   Oncology  Antimicrobials:   Anti-infectives (From admission, onward)    None          Medications  atorvastatin   40 mg Oral Daily   buPROPion  300 mg Oral QPC breakfast   Chlorhexidine Gluconate Cloth  6 each  Topical Daily   famotidine  20 mg Oral Daily   FLUoxetine  60 mg Oral q morning   pantoprazole  40 mg Oral Daily   senna-docusate  1 tablet Oral BID   sodium chloride  flush  3 mL Intravenous Q12H   topiramate   100 mg Oral QHS      Subjective:   [name] was seen and examined today.  No acute complaints.  Hematuria cleared.  CBI clamped.  Seen by urology, plan for Foley catheter removal today and if voiding successfully, plan to DC home today.  Husband at the bedside, agrees with the plan.    Objective:   Vitals:   11/16/23 2052 11/16/23 2321 11/16/23 2323 11/17/23 0511  BP: (!) 114/54 118/73 118/73 118/80  Pulse: 86 80 80 75  Resp:  16  (!) 24  Temp: 98.8 F (37.1 C) 98.7 F (37.1 C) 98.7 F (37.1 C) 98.6 F (37 C)  TempSrc: Oral Oral Oral Oral  SpO2: 100% 97% 97% 96%  Weight:      Height:        Intake/Output Summary (Last 24 hours) at 11/17/2023 1216 Last data filed at 11/17/2023 0800 Gross per 24 hour   Intake 897 ml  Output 4375 ml  Net -3478 ml     Wt Readings from Last 3 Encounters:  11/15/23 63.1 kg  06/02/23 63.1 kg  03/24/23 63.7 kg   Physical Exam General: Alert and oriented x 3, NAD Cardiovascular: S1 S2 clear, RRR.  Respiratory: CTAB Gastrointestinal: Soft, nontender, nondistended, NBS Ext: no pedal edema bilaterally Neuro: no new deficits Psych: Normal affect  Foley: Clear urine  Data Reviewed:  I have personally reviewed following labs    CBC Lab Results  Component Value Date   WBC 11.5 (H) 11/17/2023   RBC 2.85 (L) 11/17/2023   HGB 8.9 (L) 11/17/2023   HCT 28.5 (L) 11/17/2023   MCV 100.0 11/17/2023   MCH 31.2 11/17/2023   PLT 240 11/17/2023   MCHC 31.2 11/17/2023   RDW 14.8 11/17/2023   LYMPHSABS 2.3 11/13/2023   MONOABS 1.9 (H) 11/13/2023   EOSABS 0.1 11/13/2023   BASOSABS 0.1 11/13/2023     Last metabolic panel Lab Results  Component Value Date   NA 140 11/17/2023   K 3.7 11/17/2023   CL 111 11/17/2023   CO2 21 (L) 11/17/2023   BUN 9 11/17/2023   CREATININE 1.00 11/17/2023   GLUCOSE 106 (H) 11/17/2023   GFRNONAA 58 (L) 11/17/2023   GFRAA >60 04/27/2019   CALCIUM  8.1 (L) 11/17/2023   PROT 6.8 11/13/2023   ALBUMIN 3.7 11/13/2023   BILITOT 0.5 11/13/2023   ALKPHOS 103 11/13/2023   AST 58 (H) 11/13/2023   ALT 43 11/13/2023   ANIONGAP 8 11/17/2023    CBG (last 3)  No results for input(s): GLUCAP in the last 72 hours.    Coagulation Profile: No results for input(s): INR, PROTIME in the last 168 hours.   Radiology Studies: I have personally reviewed the imaging studies  US  PELVIS LIMITED (TRANSABDOMINAL ONLY) Result Date: 11/15/2023 EXAM: US  Retroperitoneum Limited, Bladder 11/15/2023 12:33:00 PM TECHNIQUE: Real-time ultrasonography of the retroperitoneum, specifically the urinary bladder, was performed. COMPARISON: None available. CLINICAL HISTORY: Hematuria. FINDINGS: BLADDER: Foley catheter within the bladder. The bladder  is mildly distended about the catheter. echogenic material layers within the bladder. IMPRESSION: 1. Mildly distended bladder with Foley catheter. echogenic material suggestive of clot or debris . Electronically signed by: Norleen Boxer MD 11/15/2023 05:42 PM  EDT RP Workstation: [id number]       [name] M.D. [facility] 11/17/2023, 12:16 PM  Available via Epic secure chat 7am-7pm After 7 pm, please refer to night coverage provider listed on amion.

## 2023-11-17 NOTE — Progress Notes (Signed)
     Subjective: No acute events overnight.  CBI on very low gtt. with clear yellow urine.  Clamped on rounds.  Objective: Vital signs in last 24 hours: Temp:  [98.5 F (36.9 C)-99.5 F (37.5 C)] 98.6 F (37 C) (10/27 0511) Pulse Rate:  [60-92] 75 (10/27 0511) Resp:  [16-24] 24 (10/27 0511) BP: (108-123)/(52-80) 118/80 (10/27 0511) SpO2:  [96 %-100 %] 96 % (10/27 0511)  Assessment/Plan: # Gross hematuria-clot retention # Multiple bladder masses  Presumed tumors bleeding on arrival.  CBI off with clear irrigant.  If still clear midday will proceed with voiding trial. Patient to follow-up with Dr. Renda on November 4 at 1 PM to discuss TURBT.  Intake/Output from previous day: 10/26 0701 - 10/27 0700 In: 897 [P.O.:250; Blood:647] Out: 5175 [Urine:5175]  Intake/Output this shift: Total I/O In: -  Out: 900 [Urine:900]  Physical Exam:  General: Alert and oriented CV: No cyanosis Lungs: equal chest rise Abdomen: Soft, NTND, no rebound or guarding Gu: 22f three-way Foley catheter in place draining clear yellow urine.  Lab Results: Recent Labs    11/15/23 1154 11/16/23 0550 11/17/23 0208  HGB 8.0* 7.5* 8.9*  HCT 24.5* 25.2* 28.5*   BMET Recent Labs    11/16/23 0550 11/17/23 0208  NA 140 140  K 3.5 3.7  CL 109 111  CO2 21* 21*  GLUCOSE 101* 106*  BUN 11 9  CREATININE 1.16* 1.00  CALCIUM  8.3* 8.1*  HGB 7.5* 8.9*  WBC 10.5 11.5*     Studies/Results: US  PELVIS LIMITED (TRANSABDOMINAL ONLY) Result Date: 11/15/2023 EXAM: US  Retroperitoneum Limited, Bladder 11/15/2023 12:33:00 PM TECHNIQUE: Real-time ultrasonography of the retroperitoneum, specifically the urinary bladder, was performed. COMPARISON: None available. CLINICAL HISTORY: Hematuria. FINDINGS: BLADDER: Foley catheter within the bladder. The bladder is mildly distended about the catheter. echogenic material layers within the bladder. IMPRESSION: 1. Mildly distended bladder with Foley catheter. echogenic  material suggestive of clot or debris . Electronically signed by: Norleen Boxer MD 11/15/2023 05:42 PM EDT RP Workstation: [id number]      LOS: 3 days   Ole Bourdon, NP [facility] Pager: [phone]  11/17/2023, 10:17 AM

## 2023-11-17 NOTE — TOC Initial Note (Signed)
 Transition of Care [facility]) - Initial/Assessment Note    Patient Details  Name: [name] MRN: [id number] Date of Birth: 04-28-1950  [facility] CM/SW Contact:    Bascom Service, RN Phone Number: 11/17/2023, 11:20 AM  Clinical Narrative:  d/c plan home. Has own transport home.                 Expected Discharge Plan: Home/Self Care Barriers to Discharge: Continued Medical Work up   Patient Goals and CMS Choice Patient states their goals for this hospitalization and ongoing recovery are:: Home CMS Medicare.gov Compare Post Acute Care list provided to:: Patient Choice offered to / list presented to : Patient [place] ownership interest in [facility].provided to:: Patient    Expected Discharge Plan and Services   Discharge Planning Services: CM Consult                                          Prior Living Arrangements/Services                       Activities of Daily Living   ADL Screening (condition at time of admission) Independently performs ADLs?: Yes (appropriate for developmental age) Is the patient deaf or have difficulty hearing?: No Does the patient have difficulty seeing, even when wearing glasses/contacts?: No Does the patient have difficulty concentrating, remembering, or making decisions?: No  Permission Sought/Granted                  Emotional Assessment              Admission diagnosis:  Gross hematuria [R31.0] Patient Active Problem List   Diagnosis Date Noted   Gross hematuria 11/14/2023   Bladder mass 11/14/2023   CKD stage 3a, GFR 45-59 ml/min (HCC) 11/14/2023   Hypertension    Chronic headache    Anxiety    Depression    CAD (coronary artery disease), native coronary artery    Aortic insufficiency    Constipation 12/16/2022   Status post ORIF of fracture of ankle 04/27/2019   Closed displaced trimalleolar fracture of right ankle 04/27/2019   PCP:  Ransom Other, MD Pharmacy:    [id number] - [place] ([place]), [place] - [address] [place] ([place]) [place] [address] Phone: [phone] Fax: [phone]  [id number] [place], [place] - [address] AT [facility] OF [address] [place] [address] Phone: [phone] Fax: [phone]  [facility] [place]  - [place] - [address] [place] [address] Phone: [phone] Fax: [phone]     Social Drivers of Health (SDOH) Social History: SDOH Screenings   Food Insecurity: No Food Insecurity (11/14/2023)  Housing: Low Risk  (11/14/2023)  Transportation Needs: No Transportation Needs (11/14/2023)  Utilities: Not At Risk (11/14/2023)  Social Connections: Moderately Isolated (11/14/2023)  Tobacco Use: Low Risk  (11/14/2023)   SDOH Interventions:     Readmission Risk Interventions     No data to display

## 2023-11-17 NOTE — Plan of Care (Signed)
 CBI clamped this morning.  Primary team proceed with voiding trial this afternoon.  Patient passed pink lemonade colored urine with a few larger clots.  This is not immediately concerning, as it is not uncommon for a few clots to settle below the level of the catheter balloon and be passed with voiding trial.  This has caused some concern. Therefore out of an abundance of caution, the patient will stay in the hospital overnight, collect string of bottles, and reassess for discharge in the morning.

## 2023-11-18 ENCOUNTER — Other Ambulatory Visit (HOSPITAL_COMMUNITY): Payer: Self-pay

## 2023-11-18 ENCOUNTER — Other Ambulatory Visit

## 2023-11-18 DIAGNOSIS — N3289 Other specified disorders of bladder: Secondary | ICD-10-CM | POA: Diagnosis not present

## 2023-11-18 DIAGNOSIS — F419 Anxiety disorder, unspecified: Secondary | ICD-10-CM | POA: Diagnosis not present

## 2023-11-18 DIAGNOSIS — R310 Gross hematuria: Secondary | ICD-10-CM | POA: Diagnosis not present

## 2023-11-18 LAB — CBC
HCT: 32.9 % — ABNORMAL LOW (ref 36.0–46.0)
Hemoglobin: 10.3 g/dL — ABNORMAL LOW (ref 12.0–15.0)
MCH: 31.2 pg (ref 26.0–34.0)
MCHC: 31.3 g/dL (ref 30.0–36.0)
MCV: 99.7 fL (ref 80.0–100.0)
Platelets: 298 K/uL (ref 150–400)
RBC: 3.3 MIL/uL — ABNORMAL LOW (ref 3.87–5.11)
RDW: 14.9 % (ref 11.5–15.5)
WBC: 13.4 K/uL — ABNORMAL HIGH (ref 4.0–10.5)
nRBC: 0 % (ref 0.0–0.2)

## 2023-11-18 LAB — BASIC METABOLIC PANEL WITH GFR
Anion gap: 10 (ref 5–15)
BUN: 9 mg/dL (ref 8–23)
CO2: 22 mmol/L (ref 22–32)
Calcium: 8.7 mg/dL — ABNORMAL LOW (ref 8.9–10.3)
Chloride: 113 mmol/L — ABNORMAL HIGH (ref 98–111)
Creatinine, Ser: 1.06 mg/dL — ABNORMAL HIGH (ref 0.44–1.00)
GFR, Estimated: 55 mL/min — ABNORMAL LOW (ref >60)
Glucose, Bld: 109 mg/dL — ABNORMAL HIGH (ref 70–99)
Potassium: 3.9 mmol/L (ref 3.5–5.1)
Sodium: 144 mmol/L (ref 135–145)

## 2023-11-18 MED ORDER — ORAL CARE MOUTH RINSE: 15.0000 mL | OROMUCOSAL | Status: DC | PRN

## 2023-11-18 MED ORDER — HYOSCYAMINE SULFATE 0.125 MG SL SUBL
0.1250 mg | SUBLINGUAL_TABLET | Freq: Four times a day (QID) | SUBLINGUAL | 0 refills | Status: DC | PRN
  Filled 2023-11-18: qty 30, 8d supply, fill #0

## 2023-11-18 MED ORDER — PROCHLORPERAZINE MALEATE 10 MG PO TABS
10.0000 mg | ORAL_TABLET | Freq: Three times a day (TID) | ORAL | 0 refills | Status: DC | PRN
  Filled 2023-11-18: qty 30, 10d supply, fill #0

## 2023-11-18 MED ORDER — OXYCODONE HCL 5 MG PO TABS
5.0000 mg | ORAL_TABLET | Freq: Four times a day (QID) | ORAL | 0 refills | Status: DC | PRN
  Filled 2023-11-18: qty 20, 5d supply, fill #0

## 2023-11-18 NOTE — Progress Notes (Signed)
 Discharge meds in a secure bag delivered to patient in room by this RN

## 2023-11-18 NOTE — Plan of Care (Signed)

## 2023-11-18 NOTE — Discharge Summary (Addendum)
 Physician Discharge Summary   Patient: [name] MRN: [id number] DOB: October 08, 1950  Admit date:     11/13/2023  Discharge date: 11/18/23  Discharge Physician: [name], MD   PCP: Ransom Other, MD   Recommendations at discharge:   Continue hyoscyamine for the bladder spasms as needed Patient has scheduled follow-up with urology, Dr. Renda on November 4 at 1 PM Continue to hold aspirin  until follow-up with PCP  Discharge Diagnoses:    Gross hematuria Acute blood loss anemia   CAD (coronary artery disease), native coronary artery   Bladder mass   Hypertension   Chronic headache   Anxiety   Depression   CKD stage 3a, GFR 45-59 ml/min [facility])    Hospital Course:  Patient is a 73 year old female past medical history significant for coronary artery disease, CKD stage IIIa, GERD, hypertension and aortic insufficiency.  Patient was admitted with hematuria.  CT scan of abdomen and pelvis with and without contrast was done on 11/11/2023 revealed bladder mass with prominent left external iliac lymph node, without evidence of renal mass or hydronephrosis.  Urology was consulted, started on CBI.   Received TXA, patient was transferred to [facility] for possible clot evacuation/fulguration in the OR   Assessment and Plan:  Gross hematuria, clot retention with multiple bladder masses - Urology was consulted, placed on continuous bladder irrigation, - catheter was exchanged for a larger Foley on 10/25 - Received TXA, patient was transferred to [facility] for possibility of clot evacuation and fulguration in the OR.  However hematuria improved and the patient did not need any surgery. - Pelvic ultrasound showed mildly distended bladder with Foley catheter, echogenic material suggestive of clot or debris - CBI was clamped off on 10/27, Foley catheter was removed.  Successfully voided, initially had few clots, cleared and patient was cleared for discharge today -H&H stable  at discharge 10.3 - Outpatient follow-up with Dr. Renda on November 4 at 1 PM   Acute blood loss anemia from hematuria -Hemoglobin 7.5 on 10/26 Transfuse 1 unit packed RBCs, hemoglobin stable 10.3 at discharge   CAD  - No anginal symptoms  - Continue to hold ASA, continue Lipitor     Hypertension  - BP stable   CKD stage IIIa - Appears close to baseline, creatinine 1.0 at discharge   Depression, anxiety  - Continue Prozac and Wellbutrin     Chronic headaches  - Continue Topamax       Estimated body mass index is 23.15 kg/m as calculated from the following:   Height as of this encounter: 5' 5 (1.651 m).   Weight as of this encounter: 63.1 kg.          Pain control - [place]  Controlled Substance Reporting System database was reviewed. and patient was instructed, not to drive, operate heavy machinery, perform activities at heights, swimming or participation in water activities or provide baby-sitting services while on Pain, Sleep and Anxiety Medications; until their outpatient Physician has advised to do so again. Also recommended to not to take more than prescribed Pain, Sleep and Anxiety Medications.  Consultants: Urology Procedures performed: CBI Disposition: Home Diet recommendation: Heart healthy diet  DISCHARGE MEDICATION: Allergies as of 11/18/2023       Reactions   Elemental Sulfur Nausea And Vomiting, Rash   Penicillins Rash        Medication List     PAUSE taking these medications    aspirin  EC 81 MG tablet Wait to take this until your  doctor or other care provider tells you to start again. Take 1 tablet (81 mg total) by mouth daily. Swallow whole.       TAKE these medications    amLODipine 5 MG tablet Commonly known as: NORVASC Take 5 mg by mouth daily.   atorvastatin  40 MG tablet Commonly known as: LIPITOR Take 1 tablet (40 mg total) by mouth daily.   buPROPion 300 MG 24 hr tablet Commonly known as: WELLBUTRIN XL Take 300 mg  by mouth daily after breakfast.   CALCIUM  + D3 PO Take 1 tablet by mouth daily at 6 (six) AM.   FLUoxetine 20 MG capsule Commonly known as: PROZAC Take 60 mg by mouth every morning.   hydrOXYzine 10 MG tablet Commonly known as: ATARAX Take 10-30 mg by mouth at bedtime as needed.   metoprolol  succinate 25 MG 24 hr tablet Commonly known as: Toprol  XL Take 1 tablet (25 mg total) by mouth daily.   multivitamin with minerals Tabs tablet Take 1 tablet by mouth every evening.   omeprazole 20 MG capsule Commonly known as: PRILOSEC Take 20 mg by mouth daily before breakfast.   Oscimin 0.125 MG Subl Generic drug: Hyoscyamine Sulfate SL Place 1 tablet (0.125 mg total) under the tongue every 6 (six) hours as needed for cramping (bladder spasm).   oxyCODONE  5 MG immediate release tablet Commonly known as: Oxy IR/ROXICODONE  Take 1 tablet (5 mg total) by mouth every 6 (six) hours as needed for moderate pain (pain score 4-6) or severe pain (pain score 7-10).   prochlorperazine 10 MG tablet Commonly known as: COMPAZINE Take 1 tablet (10 mg total) by mouth every 8 (eight) hours as needed for nausea or vomiting.   rizatriptan  10 MG tablet Commonly known as: MAXALT  Take 1 tablet (10 mg total) by mouth daily as needed.   topiramate  100 MG tablet Commonly known as: TOPAMAX  Take 1 tablet (100 mg total) by mouth at bedtime.   Vitamin D3 25 MCG (1000 UT) Caps Take 1 capsule by mouth daily.        Follow-up Information     Renda Glance, MD Follow up on 11/25/2023.   Specialty: Urology Why: At 1 PM, for hospital follow-up Contact information: [address] [place] [address] [phone]         Ransom Other, MD. Schedule an appointment as soon as possible for a visit in 2 week(s).   Specialty: Internal Medicine Why: for hospital follow-up Contact information: [address] [place] [address] [phone]                Discharge  Exam: Fredricka Weights   11/13/23 1922 11/15/23 2053  Weight: 62.6 kg 63.1 kg   S: No acute complaints, hematuria resolved, clear urine.  Husband at the bedside.  BP 132/77 (BP Location: Left Arm)   Pulse 83   Temp 97.8 F (36.6 C) (Oral)   Resp 20   Ht 5' 5 (1.651 m)   Wt 63.1 kg   SpO2 98%   BMI 23.15 kg/m   Physical Exam General: Alert and oriented x 3, NAD Cardiovascular: S1 S2 clear, RRR.  Respiratory: CTAB, no wheezing, rales or rhonchi Gastrointestinal: Soft, nontender, nondistended, NBS Ext: no pedal edema bilaterally Neuro: no new deficits Psych: Normal affect    Condition at discharge: fair  The results of significant diagnostics from this hospitalization (including imaging, microbiology, ancillary and laboratory) are listed below for reference.   Imaging Studies: US  PELVIS LIMITED (  TRANSABDOMINAL ONLY) Result Date: 11/15/2023 EXAM: US  Retroperitoneum Limited, Bladder 11/15/2023 12:33:00 PM TECHNIQUE: Real-time ultrasonography of the retroperitoneum, specifically the urinary bladder, was performed. COMPARISON: None available. CLINICAL HISTORY: Hematuria. FINDINGS: BLADDER: Foley catheter within the bladder. The bladder is mildly distended about the catheter. echogenic material layers within the bladder. IMPRESSION: 1. Mildly distended bladder with Foley catheter. echogenic material suggestive of clot or debris . Electronically signed by: Norleen Boxer MD 11/15/2023 05:42 PM EDT RP Workstation: [id number]   US  Renal Result Date: 11/14/2023 EXAM: US  Retroperitoneum Complete, Renal. CLINICAL HISTORY: eval for hematuria. TECHNIQUE: Real-time ultrasound of the retroperitoneum (complete) with image documentation. COMPARISON: CT of the Abdomen and Pelvis dated 11/11/2023. FINDINGS: RIGHT KIDNEY: The right kidney measures 8.7 x 4.3 x 3.7 cm with a volume of 73.3 cc. No hydronephrosis, renal stone, or mass visualized. LEFT KIDNEY: The left kidney measures 8.4 x 4.4 x 3.9 cm with  a volume of 75.01 cc. No hydronephrosis, renal stone, or mass visualized. BLADDER: Partially decompressed urinary bladder is identified. Abnormal, asymmetric bladder wall thickening is again noted corresponding to a mass identified within the bladder on the recent CT. Echogenic debris noted within the lumen of the bladder. IMPRESSION: 1. Findings suspicious for a bladder mass with asymmetric bladder wall thickening and intraluminal echogenic debris, correlating with the recent CT-identified bladder mass. 2. No signs of hydronephrosis or kidney mass bilaterally. Electronically signed by: Waddell Calk MD 11/14/2023 05:57 AM EDT RP Workstation: [id number]   CT ABDOMEN PELVIS W WO CONTRAST Result Date: 11/11/2023 CLINICAL DATA:  Gross hematuria 1 week ago * Tracking Code: BO * EXAM: CT ABDOMEN AND PELVIS WITHOUT AND WITH CONTRAST TECHNIQUE: Multidetector CT imaging of the abdomen and pelvis was performed following the standard protocol before and following the bolus administration of intravenous contrast. RADIATION DOSE REDUCTION: This exam was performed according to the departmental dose-optimization program which includes automated exposure control, adjustment of the mA and/or kV according to patient size and/or use of iterative reconstruction technique. CONTRAST:  125mL ISOVUE-300 IOPAMIDOL (ISOVUE-300) INJECTION 61% COMPARISON:  None Available. FINDINGS: Lower chest: No acute abnormality. Coronary artery calcifications. Large paraesophageal hernia containing the entirety of the stomach. Hepatobiliary: No solid liver abnormality is seen. Tiny gallstones. No gallbladder wall thickening, or biliary dilatation. Pancreas: Unremarkable. No pancreatic ductal dilatation or surrounding inflammatory changes. Spleen: Normal in size without significant abnormality. Adrenals/Urinary Tract: Adrenal glands are unremarkable. Kidneys are normal, without renal calculi, solid lesion, or hydronephrosis. Enhancing mass of the  anterior bladder dome measuring 4.7 x 4.2 x 2.3 cm (series 9, image 77, series 14, image 90). Additional mass of the inferior left aspect of the bladder measuring 3.0 x 1.9 x 1.5 cm (series 9, image 82, series 14, image 92). Stomach/Bowel: Stomach is within normal limits. Appendix appears normal. No evidence of bowel wall thickening, distention, or inflammatory changes. Vascular/Lymphatic: Aortic atherosclerosis. Prominent left external iliac lymph node measuring 1.0 x 0.7 cm (series 9, image 74). No enlarged abdominal or pelvic lymph nodes. Reproductive: No mass or other significant abnormality. Other: No abdominal wall hernia or abnormality. No ascites. Musculoskeletal: No acute or significant osseous findings. IMPRESSION: 1. Enhancing mass of the anterior bladder dome measuring 4.7 x 4.2 x 2.3 cm with adjacent fat stranding. Additional mass of the inferior left aspect of the bladder measuring 3.0 x 1.9 x 1.5 cm. Findings are consistent with multifocal bladder malignancy. 2. Prominent left external iliac lymph node measuring 1.0 x 0.7 cm, nonspecific although modestly suspicious for early nodal metastatic  disease. No overtly enlarged lymph nodes in the abdomen or pelvis. 3. No evidence of renal mass or hydronephrosis. 4. Large paraesophageal hernia containing the entirety of the stomach. 5. Cholelithiasis. 6. Coronary artery disease. These results will be called to the ordering clinician or representative by the Radiologist Assistant, and communication documented in the PACS or [employer]. Aortic Atherosclerosis (ICD10-I70.0). Electronically Signed   By: Marolyn JONETTA Jaksch M.D.   On: 11/11/2023 15:24    Microbiology: Results for orders placed or performed during the hospital encounter of 04/27/19  Surgical pcr screen     Status: Abnormal   Collection Time: 04/27/19  1:08 PM   Specimen: Nasal Mucosa; Nasal Swab  Result Value Ref Range Status   MRSA, PCR NEGATIVE NEGATIVE Final   Staphylococcus aureus  POSITIVE (A) NEGATIVE Final    Comment: (NOTE) The Xpert SA Assay (FDA approved for NASAL specimens in patients 68 years of age and older), is one component of a comprehensive surveillance program. It is not intended to diagnose infection nor to guide or monitor treatment. Performed at [facility], [address] [place], [place] [address]     Labs: CBC: Recent Labs  Lab 11/13/23 2010 11/14/23 0405 11/15/23 0501 11/15/23 1154 11/16/23 0550 11/17/23 0208 11/18/23 0506  WBC 14.2*   < > 10.6* 12.6* 10.5 11.5* 13.4*  NEUTROABS 9.7*  --   --   --   --   --   --   HGB 10.0*   < > 7.8* 8.0* 7.5* 8.9* 10.3*  HCT 31.5*   < > 23.7* 24.5* 25.2* 28.5* 32.9*  MCV 100.6*   < > 98.8 100.4* 104.6* 100.0 99.7  PLT 338   < > 251 254 252 240 298   < > = values in this interval not displayed.   Basic Metabolic Panel: Recent Labs  Lab 11/13/23 2010 11/14/23 0407 11/15/23 0501 11/16/23 0550 11/17/23 0208 11/18/23 0506  NA 141 141 137 140 140 144  K 3.8 4.0 3.6 3.5 3.7 3.9  CL 112* 108 107 109 111 113*  CO2 19*  --  21* 21* 21* 22  GLUCOSE 116* 105* 104* 101* 106* 109*  BUN 21 24* 15 11 9 9   CREATININE 1.35* 1.30* 1.13* 1.16* 1.00 1.06*  CALCIUM  9.4  --  8.0* 8.3* 8.1* 8.7*   Liver Function Tests: Recent Labs  Lab 11/13/23 2010  AST 58*  ALT 43  ALKPHOS 103  BILITOT 0.5  PROT 6.8  ALBUMIN 3.7   CBG: No results for input(s): GLUCAP in the last 168 hours.  Discharge time spent: greater than 30 minutes.  Signed: [name], MD [facility] 11/18/2023

## 2023-11-18 NOTE — Progress Notes (Signed)
     Subjective: No acute events overnight.  CBI on very low gtt. with clear yellow urine.  Clamped on rounds.  Objective: Vital signs in last 24 hours: Temp:  [97.8 F (36.6 C)-98.3 F (36.8 C)] 97.8 F (36.6 C) (10/28 0533) Pulse Rate:  [74-88] 83 (10/28 0533) Resp:  [18-20] 20 (10/28 0533) BP: (129-135)/(57-77) 132/77 (10/28 0533) SpO2:  [97 %-98 %] 98 % (10/28 0533)  Assessment/Plan: # Gross hematuria-clot retention # Multiple bladder masses  Presumed tumors bleeding on arrival to hospital.  Completed CBI and TOV yesterday. Some clot passage just after foley removal. Pt remained over night for monitoring and string of bottles. Clear urine on rounds today. Ready for discharge from a Urologic perspective.   Patient to follow-up with Dr. Renda on November 4 at 1 PM to discuss TURBT.  Intake/Output from previous day: 10/27 0701 - 10/28 0700 In: 243 [P.O.:240; I.V.:3] Out: 2000 [Urine:2000]  Intake/Output this shift: No intake/output data recorded.  Physical Exam:  General: Alert and oriented CV: No cyanosis Lungs: equal chest rise Gu: clear yellow urine, light blood tinge on some samples  Lab Results: Recent Labs    11/16/23 0550 11/17/23 0208 11/18/23 0506  HGB 7.5* 8.9* 10.3*  HCT 25.2* 28.5* 32.9*   BMET Recent Labs    11/17/23 0208 11/18/23 0506  NA 140 144  K 3.7 3.9  CL 111 113*  CO2 21* 22  GLUCOSE 106* 109*  BUN 9 9  CREATININE 1.00 1.06*  CALCIUM  8.1* 8.7*  HGB 8.9* 10.3*  WBC 11.5* 13.4*     Studies/Results: No results found.     LOS: 4 days   Ole Bourdon, NP [facility] Pager: [phone]  11/18/2023, 9:15 AM

## 2023-11-18 NOTE — TOC Transition Note (Signed)
 Transition of Care [facility]) - Discharge Note   Patient Details  Name: [name] MRN: [id number] Date of Birth: [birth year]  Transition of Care [facility]) CM/SW Contact:  Bascom Service, RN Phone Number: 11/18/2023, 9:59 AM   Clinical Narrative: d/c home No CM needs.      Final next level of care: Home/Self Care Barriers to Discharge: No Barriers Identified   Patient Goals and CMS Choice Patient states their goals for this hospitalization and ongoing recovery are:: Home CMS Medicare.gov Compare Post Acute Care list provided to:: Patient Choice offered to / list presented to : Patient [place] ownership interest in [facility].provided to:: Patient    Discharge Placement                       Discharge Plan and Services Additional resources added to the After Visit Summary for     Discharge Planning Services: CM Consult                                 Social Drivers of Health (SDOH) Interventions SDOH Screenings   Food Insecurity: No Food Insecurity (11/14/2023)  Housing: Low Risk  (11/14/2023)  Transportation Needs: No Transportation Needs (11/14/2023)  Utilities: Not At Risk (11/14/2023)  Social Connections: Moderately Isolated (11/14/2023)  Tobacco Use: Low Risk  (11/14/2023)     Readmission Risk Interventions     No data to display

## 2023-11-24 DIAGNOSIS — F419 Anxiety disorder, unspecified: Secondary | ICD-10-CM | POA: Diagnosis not present

## 2023-11-24 DIAGNOSIS — F332 Major depressive disorder, recurrent severe without psychotic features: Secondary | ICD-10-CM | POA: Diagnosis not present

## 2023-11-25 ENCOUNTER — Other Ambulatory Visit: Payer: Self-pay | Admitting: Urology

## 2023-11-25 ENCOUNTER — Telehealth: Payer: Self-pay | Admitting: Cardiology

## 2023-11-25 DIAGNOSIS — R399 Unspecified symptoms and signs involving the genitourinary system: Secondary | ICD-10-CM | POA: Diagnosis not present

## 2023-11-25 DIAGNOSIS — C673 Malignant neoplasm of anterior wall of bladder: Secondary | ICD-10-CM | POA: Diagnosis not present

## 2023-11-25 NOTE — Telephone Encounter (Signed)
 Pre-op clearance scheduled with Alice Bane, NP. 11/26/23     Patient Consent for Virtual Visit        [name] has provided verbal consent on 11/25/2023 for a virtual visit (video or telephone).   CONSENT FOR VIRTUAL VISIT FOR:  [name]  By participating in this virtual visit I agree to the following:  I hereby voluntarily request, consent and authorize [facility] and its employed or contracted physicians, physician assistants, nurse practitioners or other licensed health care professionals (the Practitioner), to provide me with telemedicine health care services (the "Services) as deemed necessary by the treating Practitioner. I acknowledge and consent to receive the Services by the Practitioner via telemedicine. I understand that the telemedicine visit will involve communicating with the Practitioner through live audiovisual communication technology and the disclosure of certain medical information by electronic transmission. I acknowledge that I have been given the opportunity to request an in-person assessment or other available alternative prior to the telemedicine visit and am voluntarily participating in the telemedicine visit.  I understand that I have the right to withhold or withdraw my consent to the use of telemedicine in the course of my care at any time, without affecting my right to future care or treatment, and that the Practitioner or I may terminate the telemedicine visit at any time. I understand that I have the right to inspect all information obtained and/or recorded in the course of the telemedicine visit and may receive copies of available information for a reasonable fee.  I understand that some of the potential risks of receiving the Services via telemedicine include:  Delay or interruption in medical evaluation due to technological equipment failure or disruption; Information transmitted may not be sufficient (e.g. poor resolution of images) to  allow for appropriate medical decision making by the Practitioner; and/or  In rare instances, security protocols could fail, causing a breach of personal health information.  Furthermore, I acknowledge that it is my responsibility to provide information about my medical history, conditions and care that is complete and accurate to the best of my ability. I acknowledge that Practitioner's advice, recommendations, and/or decision may be based on factors not within their control, such as incomplete or inaccurate data provided by me or distortions of diagnostic images or specimens that may result from electronic transmissions. I understand that the practice of medicine is not an exact science and that Practitioner makes no warranties or guarantees regarding treatment outcomes. I acknowledge that a copy of this consent can be made available to me via my patient portal [facility] MyChart), or I can request a printed copy by calling the office of [facility].    I understand that my insurance will be billed for this visit.   I have read or had this consent read to me. I understand the contents of this consent, which adequately explains the benefits and risks of the Services being provided via telemedicine.  I have been provided ample opportunity to ask questions regarding this consent and the Services and have had my questions answered to my satisfaction. I give my informed consent for the services to be provided through the use of telemedicine in my medical care

## 2023-11-25 NOTE — Telephone Encounter (Signed)
   Pre-operative Risk Assessment    Patient Name: [name]  DOB: 04-May-1950 MRN: [id number]      Request for Surgical Clearance    Procedure:  Resection Of Bladder Tumor  Date of Surgery:  Clearance 12/04/23                                 Surgeon: Dr. Ricardo Likens Surgeon's Group or Practice Name: [facility] Phone number: [phone] Fax number: [phone]   Type of Clearance Requested:   - Medical    Type of Anesthesia:  General    Additional requests/questions:    [name]   11/25/2023, 4:38 PM

## 2023-11-25 NOTE — Telephone Encounter (Signed)
 Primary Cardiologist:None   Preoperative team, please contact this patient and set up a phone call appointment for further preoperative risk assessment. Please obtain consent and complete medication review. Thank you for your help.   I confirm that guidance regarding antiplatelet and oral anticoagulation therapy has been completed and, if necessary, noted below.  Aspirin  is currently being held. No request submitted to hold it.   I also confirmed the patient resides in the state of  . As per [facility] Medical Board telemedicine laws, the patient must reside in the state in which the provider is licensed.   Rosaline EMERSON Bane, NP-C  11/25/2023, 4:49 PM [address] [place], [place] [address] Office [phone] Fax [phone]

## 2023-11-26 ENCOUNTER — Ambulatory Visit: Attending: Cardiology | Admitting: Physician Assistant

## 2023-11-26 DIAGNOSIS — Z01810 Encounter for preprocedural cardiovascular examination: Secondary | ICD-10-CM

## 2023-11-26 NOTE — Progress Notes (Signed)
 Virtual Visit via Telephone Note   Because of BRETTANY SYDNEY co-morbid illnesses, she is at least at moderate risk for complications without adequate follow up.  This format is felt to be most appropriate for this patient at this time.  Due to technical limitations with video connection web designer), today's appointment will be conducted as an audio only telehealth visit, and [name] verbally agreed to proceed in this manner.   All issues noted in this document were discussed and addressed.  No physical exam could be performed with this format.  Evaluation Performed:  Preoperative cardiovascular risk assessment _____________   Date:  11/26/2023   Patient ID:  [name], [name], [name], MRN [id number] Patient Location:  Home Provider location:   Office  Primary Care Provider:  Ransom Other, MD Primary Cardiologist:  None  Chief Complaint / Patient Profile   73 y.o. y/o female with a h/o abdominal hernia, anxiety, depression, GERD, CAD with high calcium  score, and hypertension  who is pending resection of a bladder tumor and presents today for telephonic preoperative cardiovascular risk assessment.  History of Present Illness    [name] is a 73 y.o. female who presents via audio/video conferencing for a telehealth visit today.  Pt was last seen in cardiology clinic on 5/Parker/2025 by Dr. Shlomo.  At that time [name] was doing well.  The patient is now pending procedure as outlined above. Since her last visit, she tells me she has been doing well from a cardiovascular standpoint.  She is not having any chest pain or shortness of breath.  No swelling in her feet or any and no palpitations or fast heartbeats.  She said she stopped taking her aspirin  after she left the hospital because she was told to do so.  It does look like she was recently admitted for gross hematuria.  She is now scheduled for a transurethral resection of the bladder tumor which will take place on  12/04/2023.  Aspirin  is currently being held. No request submitted to hold it.   Past Medical History    Past Medical History:  Diagnosis Date   Abdominal hernia    Anxiety    Aortic insufficiency    Moderate by echo 03/2023   CAD (coronary artery disease), native coronary artery    Coronary CTA showed coronary Ca score of 883 with severe total plaque volume and moderate stenosis of of the ostial and mid LAD and prox diag and occluded nondominant RCA.  FFR only mildly abnormal in the distal LAD and LCx.   Chronic back pain    Chronic headache    migraine     CKD stage 3a, GFR 45-59 ml/min (HCC) 11/14/2023   Closed right ankle fracture    Depression    GERD (gastroesophageal reflux disease)    Headache    migraine   Hypertension    Past Surgical History:  Procedure Laterality Date   CESAREAN SECTION  1983, 1985   x2   COLONOSCOPY     ORIF ANKLE FRACTURE Right 04/27/2019   Procedure: OPEN REDUCTION INTERNAL FIXATION (ORIF) ANKLE FRACTURE;  Surgeon: Sharl Selinda Dover, MD;  Location: [facility] OR;  Service: Orthopedics;  Laterality: Right;  2 hrs RNFA   WISDOM TOOTH EXTRACTION      Allergies  Allergies  Allergen Reactions   Elemental Sulfur Nausea And Vomiting and Rash   Penicillins Rash    Home Medications    Prior to Admission medications   Medication  Sig Start Date End Date Taking? Authorizing Provider  amLODipine (NORVASC) 5 MG tablet Take 5 mg by mouth daily.  04/08/19   [provider]  aspirin  EC 81 MG tablet Take 1 tablet (81 mg total) by mouth daily. Swallow whole. 04/23/23   Shlomo Wilbert SAUNDERS, MD  atorvastatin  (LIPITOR) 40 MG tablet Take 1 tablet (40 mg total) by mouth daily. 07/24/23 11/13/24  Shlomo Wilbert SAUNDERS, MD  buPROPion (WELLBUTRIN XL) 300 MG 24 hr tablet Take 300 mg by mouth daily after breakfast.     [provider]  Calcium  Carb-Cholecalciferol (CALCIUM  + D3 PO) Take 1 tablet by mouth daily at 6 (six) AM.    [provider]   Cholecalciferol (VITAMIN D3) 25 MCG (1000 UT) CAPS Take 1 capsule by mouth daily.    [provider]  FLUoxetine (PROZAC) 20 MG capsule Take 60 mg by mouth every morning.    [provider]  hydrOXYzine (ATARAX) 10 MG tablet Take 10-30 mg by mouth at bedtime as needed.    [provider]  hyoscyamine (LEVSIN SL) 0.125 MG SL tablet Place 1 tablet (0.125 mg total) under the tongue every 6 (six) hours as needed for cramping (bladder spasm). 11/18/23   Rai, Nydia POUR, MD  metoprolol  succinate (TOPROL  XL) 25 MG 24 hr tablet Take 1 tablet (25 mg total) by mouth daily. 04/23/23   Shlomo Wilbert SAUNDERS, MD  Multiple Vitamin (MULTIVITAMIN WITH MINERALS) TABS tablet Take 1 tablet by mouth every evening.    [provider]  omeprazole (PRILOSEC) 20 MG capsule Take 20 mg by mouth daily before breakfast.     [provider]  oxyCODONE  (OXY IR/ROXICODONE ) 5 MG immediate release tablet Take 1 tablet (5 mg total) by mouth every 6 (six) hours as needed for moderate pain (pain score 4-6) or severe pain (pain score 7-10). 11/18/23   Rai, Nydia POUR, MD  prochlorperazine (COMPAZINE) 10 MG tablet Take 1 tablet (10 mg total) by mouth every 8 (eight) hours as needed for nausea or vomiting. 11/18/23   Rai, Nydia POUR, MD  rizatriptan  (MAXALT ) 10 MG tablet Take 1 tablet (10 mg total) by mouth daily as needed. 08/21/12   Athar, Saima, MD  topiramate  (TOPAMAX ) 100 MG tablet Take 1 tablet (100 mg total) by mouth at bedtime. 08/21/12   Buck Saucer, MD    Physical Exam    Vital Signs:  [name] does not have vital signs available for review today.  Given telephonic nature of communication, physical exam is limited. AAOx3. NAD. Normal affect.  Speech and respirations are unlabored.  Accessory Clinical Findings    None  Assessment & Plan    1.  Preoperative Cardiovascular Risk Assessment:  [name] perioperative risk of a major cardiac event is 0.9% according to the Revised  Cardiac Risk Index (RCRI).  Therefore, she is at low risk for perioperative complications.   Her functional capacity is good at 5.07 METs according to the Duke Activity Status Index (DASI). Recommendations: According to ACC/AHA guidelines, no further cardiovascular testing needed.  The patient may proceed to surgery at acceptable risk.     The patient was advised that if she develops new symptoms prior to surgery to contact our office to arrange for a follow-up visit, and she verbalized understanding.   A copy of this note will be routed to requesting surgeon.  Time:   Today, I have spent 7 minutes with the patient with telehealth technology discussing medical history, symptoms, and  management plan.     Alice LOISE Fabry, PA-C  11/26/2023, 4:27 PM

## 2023-12-02 DIAGNOSIS — E782 Mixed hyperlipidemia: Secondary | ICD-10-CM | POA: Diagnosis not present

## 2023-12-02 DIAGNOSIS — Z1331 Encounter for screening for depression: Secondary | ICD-10-CM | POA: Diagnosis not present

## 2023-12-02 DIAGNOSIS — I2510 Atherosclerotic heart disease of native coronary artery without angina pectoris: Secondary | ICD-10-CM | POA: Diagnosis not present

## 2023-12-02 DIAGNOSIS — Z0000 Encounter for general adult medical examination without abnormal findings: Secondary | ICD-10-CM | POA: Diagnosis not present

## 2023-12-02 DIAGNOSIS — I10 Essential (primary) hypertension: Secondary | ICD-10-CM | POA: Diagnosis not present

## 2023-12-02 DIAGNOSIS — C679 Malignant neoplasm of bladder, unspecified: Secondary | ICD-10-CM | POA: Diagnosis not present

## 2023-12-02 DIAGNOSIS — M8580 Other specified disorders of bone density and structure, unspecified site: Secondary | ICD-10-CM | POA: Diagnosis not present

## 2023-12-02 DIAGNOSIS — I351 Nonrheumatic aortic (valve) insufficiency: Secondary | ICD-10-CM | POA: Diagnosis not present

## 2023-12-02 DIAGNOSIS — N1831 Chronic kidney disease, stage 3a: Secondary | ICD-10-CM | POA: Diagnosis not present

## 2023-12-02 DIAGNOSIS — F331 Major depressive disorder, recurrent, moderate: Secondary | ICD-10-CM | POA: Diagnosis not present

## 2023-12-02 DIAGNOSIS — D649 Anemia, unspecified: Secondary | ICD-10-CM | POA: Diagnosis not present

## 2023-12-02 NOTE — Progress Notes (Signed)
 Date of COVID positive in last 90 days:  PCP - Ardell Manly, MD Cardiologist - Wilbert Bihari, MD/Tessa Lucien, PA-C  Cardiac clearance in Epic dated 11-26-23  Chest x-ray - N/A EKG - 03-24-23 Epic Stress Test - N/A ECHO - 04-16-23 Epic Cardiac Cath - N/A Coronary CT - 04-17-23 Epic Pacemaker/ICD device last checked:N/A Spinal Cord Stimulator:N/A  Bowel Prep - N/A  Sleep Study - N/A CPAP -   Fasting Blood Sugar - N/A Checks Blood Sugar _____ times a day  Last dose of GLP1 agonist-  N/A GLP1 instructions:  Do not take after     Last dose of SGLT-2 inhibitors-  N/A SGLT-2 instructions:  Do not take after     Blood Thinner Instructions: N/A Last dose:   Time: Aspirin  Instructions:  ASA 81 Last Dose:  Activity level:  Can go up a flight of stairs and perform activities of daily living without stopping and without symptoms of chest pain or shortness of breath.  Able to exercise without symptoms  Unable to go up a flight of stairs without symptoms of     Anesthesia review: CAD, aortic insufficiency, HTN, CKD  Patient denies shortness of breath, fever, cough and chest pain at PAT appointment  Patient verbalized understanding of instructions that were given to them at the PAT appointment. Patient was also instructed that they will need to review over the PAT instructions again at home before surgery.

## 2023-12-02 NOTE — Patient Instructions (Addendum)
 SURGICAL WAITING ROOM VISITATION Patients having surgery or a procedure may have no more than 2 support people in the waiting area - these visitors may rotate.    Children under the age of 44 must have an adult with them who is not the patient.  If the patient needs to stay at the hospital during part of their recovery, the visitor guidelines for inpatient rooms apply. Pre-op nurse will coordinate an appropriate time for 1 support person to accompany patient in pre-op.  This support person may not rotate.    Please refer to the [facility] website for the visitor guidelines for Inpatients (after your surgery is over and you are in a regular room).       Your procedure is scheduled on: 12-04-23   Report to [facility] Main Entrance    Report to admitting at 10:45 AM   Call this number if you have problems the morning of surgery [phone]   Do not eat food or drink liquids :After Midnight.           If you have questions, please contact your surgeon's office.   FOLLOW  ANY ADDITIONAL PRE OP INSTRUCTIONS YOU RECEIVED FROM YOUR SURGEON'S OFFICE!!!     Oral Hygiene is also important to reduce your risk of infection.                                    Remember - BRUSH YOUR TEETH THE MORNING OF SURGERY WITH YOUR REGULAR TOOTHPASTE   Do NOT smoke after Midnight   Take these medicines the morning of surgery with A SIP OF WATER:    Amlodipine   Bupropion   Fluoxetine   Omeprazole   If needed Oxycodone , Compazine, Maxalt   Stop all vitamins and herbal supplements 7 days before surgery  Bring CPAP mask and tubing day of surgery.                              You may not have any metal on your body including hair pins, jewelry, and body piercing             Do not wear make-up, lotions, powders, perfumes or deodorant  Do not wear nail polish including gel and S&S, artificial/acrylic nails, or any other type of covering on natural nails including finger and toenails. If  you have artificial nails, gel coating, etc. that needs to be removed by a nail salon please have this removed prior to surgery or surgery may need to be canceled/ delayed if the surgeon/ anesthesia feels like they are unable to be safely monitored.   Do not shave  48 hours prior to surgery.        Do not bring valuables to the hospital. [facility] IS NOT RESPONSIBLE   FOR VALUABLES.   Contacts, dentures or bridgework may not be worn into surgery.  DO NOT BRING YOUR HOME MEDICATIONS TO THE HOSPITAL. PHARMACY WILL DISPENSE MEDICATIONS LISTED ON YOUR MEDICATION LIST TO YOU DURING YOUR ADMISSION IN THE HOSPITAL!    Patients discharged on the day of surgery will not be allowed to drive home.  Someone NEEDS to stay with you for the first 24 hours after anesthesia.   Special Instructions: Bring a copy of your healthcare power of attorney and living will documents the day of surgery if you haven't scanned them before.  Please read over the following fact sheets you were given: IF YOU HAVE QUESTIONS ABOUT YOUR PRE-OP INSTRUCTIONS PLEASE CALL [phone] Gwen  If you received a COVID test during your pre-op visit  it is requested that you wear a mask when out in public, stay away from anyone that may not be feeling well and notify your surgeon if you develop symptoms. If you test positive for Covid or have been in contact with anyone that has tested positive in the last 10 days please notify you surgeon.  [place] - Preparing for Surgery Before surgery, you can play an important role.  Because skin is not sterile, your skin needs to be as free of germs as possible.  You can reduce the number of germs on your skin by washing with CHG (chlorahexidine gluconate) soap before surgery.  CHG is an antiseptic cleaner which kills germs and bonds with the skin to continue killing germs even after washing. Please DO NOT use if you have an allergy to CHG or antibacterial soaps.  If your skin becomes  reddened/irritated stop using the CHG and inform your nurse when you arrive at Short Stay. Do not shave (including legs and underarms) for at least 48 hours prior to the first CHG shower.  You may shave your face/neck.  Please follow these instructions carefully:  1.  Shower with CHG Soap the night before surgery and the  morning of surgery.  2.  If you choose to wash your hair, wash your hair first as usual with your normal  shampoo.  3.  After you shampoo, rinse your hair and body thoroughly to remove the shampoo.                             4.  Use CHG as you would any other liquid soap.  You can apply chg directly to the skin and wash.  Gently with a scrungie or clean washcloth.  5.  Apply the CHG Soap to your body ONLY FROM THE NECK DOWN.   Do   not use on face/ open                           Wound or open sores. Avoid contact with eyes, ears mouth and   genitals (private parts).                       Wash face,  Genitals (private parts) with your normal soap.             6.  Wash thoroughly, paying special attention to the area where your    surgery  will be performed.  7.  Thoroughly rinse your body with warm water from the neck down.  8.  DO NOT shower/wash with your normal soap after using and rinsing off the CHG Soap.                9.  Pat yourself dry with a clean towel.            10.  Wear clean pajamas.            11.  Place clean sheets on your bed the night of your first shower and do not  sleep with pets. Day of Surgery : Do not apply any lotions/deodorants the morning of surgery.  Please wear clean clothes to the hospital/surgery center.  FAILURE TO  FOLLOW THESE INSTRUCTIONS MAY RESULT IN THE CANCELLATION OF YOUR SURGERY  PATIENT SIGNATURE_________________________________  NURSE SIGNATURE__________________________________  ________________________________________________________________________

## 2023-12-03 ENCOUNTER — Other Ambulatory Visit: Payer: Self-pay

## 2023-12-03 ENCOUNTER — Encounter (HOSPITAL_COMMUNITY): Payer: Self-pay

## 2023-12-03 ENCOUNTER — Encounter (HOSPITAL_COMMUNITY)
Admission: RE | Admit: 2023-12-03 | Discharge: 2023-12-03 | Disposition: A | Discharge status: Home / Self Care | Source: Ambulatory Visit | Attending: Urology | Admitting: Urology

## 2023-12-03 VITALS — BP 126/61 | HR 61 | Temp 97.8°F | Resp 16 | Ht 65.0 in | Wt 132.4 lb

## 2023-12-03 DIAGNOSIS — C673 Malignant neoplasm of anterior wall of bladder: Secondary | ICD-10-CM | POA: Diagnosis not present

## 2023-12-03 DIAGNOSIS — K219 Gastro-esophageal reflux disease without esophagitis: Secondary | ICD-10-CM | POA: Insufficient documentation

## 2023-12-03 DIAGNOSIS — I351 Nonrheumatic aortic (valve) insufficiency: Secondary | ICD-10-CM | POA: Diagnosis not present

## 2023-12-03 DIAGNOSIS — N183 Chronic kidney disease, stage 3 unspecified: Secondary | ICD-10-CM | POA: Diagnosis not present

## 2023-12-03 DIAGNOSIS — D649 Anemia, unspecified: Secondary | ICD-10-CM | POA: Diagnosis not present

## 2023-12-03 DIAGNOSIS — I2510 Atherosclerotic heart disease of native coronary artery without angina pectoris: Secondary | ICD-10-CM | POA: Diagnosis not present

## 2023-12-03 DIAGNOSIS — I129 Hypertensive chronic kidney disease with stage 1 through stage 4 chronic kidney disease, or unspecified chronic kidney disease: Secondary | ICD-10-CM | POA: Insufficient documentation

## 2023-12-03 DIAGNOSIS — Z01812 Encounter for preprocedural laboratory examination: Secondary | ICD-10-CM | POA: Diagnosis not present

## 2023-12-03 HISTORY — DX: Unspecified osteoarthritis, unspecified site: M19.90

## 2023-12-03 HISTORY — DX: Anemia, unspecified: D64.9

## 2023-12-03 LAB — BASIC METABOLIC PANEL WITH GFR
Anion gap: 8 (ref 5–15)
BUN: 20 mg/dL (ref 8–23)
CO2: 22 mmol/L (ref 22–32)
Calcium: 9.2 mg/dL (ref 8.9–10.3)
Chloride: 111 mmol/L (ref 98–111)
Creatinine, Ser: 1.09 mg/dL — ABNORMAL HIGH (ref 0.44–1.00)
GFR, Estimated: 53 mL/min — ABNORMAL LOW (ref >60)
Glucose, Bld: 92 mg/dL (ref 70–99)
Potassium: 4.6 mmol/L (ref 3.5–5.1)
Sodium: 142 mmol/L (ref 135–145)

## 2023-12-03 LAB — CBC
HCT: 33.5 % — ABNORMAL LOW (ref 36.0–46.0)
Hemoglobin: 10.7 g/dL — ABNORMAL LOW (ref 12.0–15.0)
MCH: 31.6 pg (ref 26.0–34.0)
MCHC: 31.9 g/dL (ref 30.0–36.0)
MCV: 98.8 fL (ref 80.0–100.0)
Platelets: 355 K/uL (ref 150–400)
RBC: 3.39 MIL/uL — ABNORMAL LOW (ref 3.87–5.11)
RDW: 13.1 % (ref 11.5–15.5)
WBC: 9.5 K/uL (ref 4.0–10.5)
nRBC: 0 % (ref 0.0–0.2)

## 2023-12-03 MED ORDER — GENTAMICIN SULFATE 40 MG/ML IJ SOLN
5.0000 mg/kg | INTRAVENOUS | Status: AC
  Administered 2023-12-04: 320 mg via INTRAVENOUS
  Filled 2023-12-03: qty 8

## 2023-12-03 NOTE — Progress Notes (Addendum)
 Anesthesia Chart Review   Case: [id number] Date/Time: 12/04/23 1245   Procedures:      TURBT (TRANSURETHRAL RESECTION OF BLADDER TUMOR)     CYSTOSCOPY, WITH RETROGRADE PYELOGRAM (Bilateral)   Anesthesia type: General   Diagnosis: Malignant neoplasm of anterior wall of urinary bladder (HCC) [C67.3]   Pre-op diagnosis: BLADDER CANCER   Location: [facility] ROOM 05 / [facility]   Surgeons: Alvaro Ricardo KATHEE Mickey., MD       DISCUSSION:73 y.o. never smoker with h/o HTN, GERD, CAD, CKD stage III, bladder cancer scheduled for above procedure 12/04/2023 with Dr. Ricardo Alvaro.   Per cardiology preoperative evaluation 11/26/2023, Ms. Shehadeh's perioperative risk of a major cardiac event is 0.9% according to the Revised Cardiac Risk Index (RCRI).  Therefore, she is at low risk for perioperative complications.   Her functional capacity is good at 5.07 METs according to the Duke Activity Status Index (DASI). Recommendations: According to ACC/AHA guidelines, no further cardiovascular testing needed.  The patient may proceed to surgery at acceptable risk.  VS: BP 126/61   Pulse 61   Temp 36.6 C (Oral)   Resp 16   Ht 5' 5 (1.651 m)   Wt 60.1 kg   SpO2 100%   BMI 22.03 kg/m   PROVIDERS: Ransom Other, MD is PCP   Cardiologist - Wilbert Bihari, MD  LABS: Labs reviewed: Acceptable for surgery. (all labs ordered are listed, but only abnormal results are displayed)  Labs Reviewed  BASIC METABOLIC PANEL WITH GFR - Abnormal; Notable for the following components:      Result Value   Creatinine, Ser 1.09 (*)    GFR, Estimated 53 (*)    All other components within normal limits  CBC - Abnormal; Notable for the following components:   RBC 3.39 (*)    Hemoglobin 10.7 (*)    HCT 33.5 (*)    All other components within normal limits     IMAGES:   EKG:   CV: CT Coronary 04/17/2023 FINDINGS: FFRct analysis was performed on the original cardiac CT angiogram dataset. Diagrammatic representation of the  FFRct analysis is provided in a separate PDF document in PACS. This dictation was created using the PDF document and an interactive 3D model of the results. 3D model is not available in the EMR/PACS. Normal FFR range is >0.80.   1. Left Main: findings   2. LAD: findings 0.90, 0.80 0.75    diagonal: 0.81   3. LCX: findings 0.92, 0.74   4.      RCA: findings Modeled CTO   IMPRESSION: FFR mildly abnormal in the very distal LAD and Lcx; best option may be medical therapy.  Echo 04/16/2023  1. Left ventricular ejection fraction, by estimation, is 60 to 65%. The  left ventricle has normal function. The left ventricle has no regional  wall motion abnormalities. Left ventricular diastolic parameters are  indeterminate. Elevated left ventricular  end-diastolic pressure. The average left ventricular global longitudinal  strain is -16.5 %. The global longitudinal strain is normal.   2. Right ventricular systolic function is normal. The right ventricular  size is normal. Tricuspid regurgitation signal is inadequate for assessing  PA pressure.   3. The mitral valve is degenerative. Trivial mitral valve regurgitation.  No evidence of mitral stenosis.   4. The aortic valve is tricuspid. Aortic valve regurgitation is moderate.  Aortic valve sclerosis/calcification is present, without any evidence of  aortic stenosis. Aortic valve area, by VTI measures 2.06 cm. Aortic valve  mean gradient measures 7.0  mmHg. Aortic valve Vmax measures 1.81 m/s.   5. The inferior vena cava is normal in size with greater than 50%  respiratory variability, suggesting right atrial pressure of 3 mmHg.   Past Medical History:  Diagnosis Date   Abdominal hernia    Anemia    Anxiety    Aortic insufficiency    Moderate by echo 03/2023   Arthritis    CAD (coronary artery disease), native coronary artery    Coronary CTA showed coronary Ca score of 883 with severe total plaque volume and moderate stenosis of of the  ostial and mid LAD and prox diag and occluded nondominant RCA.  FFR only mildly abnormal in the distal LAD and LCx.   Chronic back pain    Chronic headache    migraine     CKD stage 3a, GFR 45-59 ml/min (HCC) 11/14/2023   Closed right ankle fracture    Depression    GERD (gastroesophageal reflux disease)    Headache    migraine   Hypertension     Past Surgical History:  Procedure Laterality Date   CATARACT EXTRACTION W/ INTRAOCULAR LENS IMPLANT     CESAREAN SECTION  1983, 1985   x2   COLONOSCOPY     ORIF ANKLE FRACTURE Right 04/27/2019   Procedure: OPEN REDUCTION INTERNAL FIXATION (ORIF) ANKLE FRACTURE;  Surgeon: Sharl Selinda Dover, MD;  Location: [facility] OR;  Service: Orthopedics;  Laterality: Right;  2 hrs RNFA   WISDOM TOOTH EXTRACTION      MEDICATIONS:  amLODipine (NORVASC) 5 MG tablet   [Paused] aspirin  EC 81 MG tablet   atorvastatin  (LIPITOR) 40 MG tablet   buPROPion (WELLBUTRIN XL) 300 MG 24 hr tablet   Calcium  Carb-Cholecalciferol (CALCIUM  + D3 PO)   Cholecalciferol (VITAMIN D3) 25 MCG (1000 UT) CAPS   FLUoxetine (PROZAC) 20 MG capsule   hydrOXYzine (ATARAX) 10 MG tablet   hyoscyamine (LEVSIN SL) 0.125 MG SL tablet   metoprolol  succinate (TOPROL  XL) 25 MG 24 hr tablet   Multiple Vitamin (MULTIVITAMIN WITH MINERALS) TABS tablet   omeprazole (PRILOSEC) 20 MG capsule   oxyCODONE  (OXY IR/ROXICODONE ) 5 MG immediate release tablet   prochlorperazine (COMPAZINE) 10 MG tablet   rizatriptan  (MAXALT ) 10 MG tablet   topiramate  (TOPAMAX ) 100 MG tablet   No current facility-administered medications for this encounter.    [START ON 12/04/2023] gentamicin (GARAMYCIN) 320 mg in dextrose  5 % 100 mL IVPB    Jacqulene Huntley Ward, PA-C [facility] Pre-Surgical Testing [phone]

## 2023-12-04 ENCOUNTER — Ambulatory Visit (HOSPITAL_COMMUNITY)
Admission: RE | Admit: 2023-12-04 | Discharge: 2023-12-04 | Disposition: A | Discharge status: Home / Self Care | Attending: Urology | Admitting: Urology

## 2023-12-04 ENCOUNTER — Ambulatory Visit (HOSPITAL_COMMUNITY): Admitting: Physician Assistant

## 2023-12-04 ENCOUNTER — Ambulatory Visit (HOSPITAL_COMMUNITY)

## 2023-12-04 ENCOUNTER — Ambulatory Visit (HOSPITAL_COMMUNITY): Admitting: Anesthesiology

## 2023-12-04 ENCOUNTER — Encounter (HOSPITAL_COMMUNITY)
Admission: RE | Disposition: A | Discharge status: Home / Self Care | Payer: Self-pay | Source: Home / Self Care | Attending: Urology

## 2023-12-04 ENCOUNTER — Other Ambulatory Visit: Payer: Self-pay

## 2023-12-04 ENCOUNTER — Encounter (HOSPITAL_COMMUNITY): Payer: Self-pay | Admitting: Urology

## 2023-12-04 DIAGNOSIS — C679 Malignant neoplasm of bladder, unspecified: Secondary | ICD-10-CM

## 2023-12-04 DIAGNOSIS — C673 Malignant neoplasm of anterior wall of bladder: Secondary | ICD-10-CM | POA: Diagnosis present

## 2023-12-04 DIAGNOSIS — I2510 Atherosclerotic heart disease of native coronary artery without angina pectoris: Secondary | ICD-10-CM | POA: Diagnosis not present

## 2023-12-04 DIAGNOSIS — D494 Neoplasm of unspecified behavior of bladder: Secondary | ICD-10-CM | POA: Diagnosis not present

## 2023-12-04 DIAGNOSIS — I351 Nonrheumatic aortic (valve) insufficiency: Secondary | ICD-10-CM | POA: Insufficient documentation

## 2023-12-04 DIAGNOSIS — I10 Essential (primary) hypertension: Secondary | ICD-10-CM | POA: Diagnosis not present

## 2023-12-04 DIAGNOSIS — K219 Gastro-esophageal reflux disease without esophagitis: Secondary | ICD-10-CM | POA: Diagnosis not present

## 2023-12-04 DIAGNOSIS — D090 Carcinoma in situ of bladder: Secondary | ICD-10-CM | POA: Diagnosis not present

## 2023-12-04 DIAGNOSIS — N201 Calculus of ureter: Secondary | ICD-10-CM | POA: Diagnosis not present

## 2023-12-04 HISTORY — PX: TRANSURETHRAL RESECTION OF BLADDER TUMOR: SHX2575

## 2023-12-04 HISTORY — PX: CYSTOSCOPY W/ RETROGRADES: SHX1426

## 2023-12-04 SURGERY — TURBT (TRANSURETHRAL RESECTION OF BLADDER TUMOR)
Anesthesia: General | Site: Bladder

## 2023-12-04 MED ORDER — FENTANYL CITRATE (PF) 50 MCG/ML IJ SOSY
PREFILLED_SYRINGE | INTRAMUSCULAR | Status: AC
  Filled 2023-12-04: qty 1

## 2023-12-04 MED ORDER — ACETAMINOPHEN 10 MG/ML IV SOLN
1000.0000 mg | Freq: Once | INTRAVENOUS | Status: DC | PRN
  Administered 2023-12-04: 1000 mg via INTRAVENOUS

## 2023-12-04 MED ORDER — AMISULPRIDE (ANTIEMETIC) 5 MG/2ML IV SOLN: 10.0000 mg | Freq: Once | INTRAVENOUS | Status: DC | PRN

## 2023-12-04 MED ORDER — DEXAMETHASONE SOD PHOSPHATE PF 10 MG/ML IJ SOLN
INTRAMUSCULAR | Status: DC | PRN
  Administered 2023-12-04: 6 mg via INTRAVENOUS

## 2023-12-04 MED ORDER — FENTANYL CITRATE (PF) 50 MCG/ML IJ SOSY
PREFILLED_SYRINGE | INTRAMUSCULAR | Status: AC
  Filled 2023-12-04: qty 2

## 2023-12-04 MED ORDER — FENTANYL CITRATE (PF) 100 MCG/2ML IJ SOLN
INTRAMUSCULAR | Status: DC | PRN
  Administered 2023-12-04 (×4): 25 ug via INTRAVENOUS

## 2023-12-04 MED ORDER — PHENYLEPHRINE 80 MCG/ML (10ML) SYRINGE FOR IV PUSH (FOR BLOOD PRESSURE SUPPORT)
PREFILLED_SYRINGE | INTRAVENOUS | Status: AC
  Filled 2023-12-04: qty 10

## 2023-12-04 MED ORDER — SENNOSIDES-DOCUSATE SODIUM 8.6-50 MG PO TABS: 1.0000 | ORAL_TABLET | Freq: Two times a day (BID) | ORAL | 0 refills | Status: AC

## 2023-12-04 MED ORDER — OXYCODONE HCL 5 MG PO TABS: 5.0000 mg | ORAL_TABLET | Freq: Four times a day (QID) | ORAL | 0 refills | Status: DC | PRN

## 2023-12-04 MED ORDER — FENTANYL CITRATE (PF) 50 MCG/ML IJ SOSY
25.0000 ug | PREFILLED_SYRINGE | INTRAMUSCULAR | Status: DC | PRN
  Administered 2023-12-04 (×3): 50 ug via INTRAVENOUS

## 2023-12-04 MED ORDER — HYDROMORPHONE HCL 1 MG/ML IJ SOLN
0.2500 mg | INTRAMUSCULAR | Status: DC | PRN
  Administered 2023-12-04: 0.5 mg via INTRAVENOUS

## 2023-12-04 MED ORDER — LIDOCAINE HCL (PF) 2 % IJ SOLN
INTRAMUSCULAR | Status: AC
  Filled 2023-12-04: qty 5

## 2023-12-04 MED ORDER — PHENYLEPHRINE 80 MCG/ML (10ML) SYRINGE FOR IV PUSH (FOR BLOOD PRESSURE SUPPORT)
PREFILLED_SYRINGE | INTRAVENOUS | Status: DC | PRN
  Administered 2023-12-04: 80 ug via INTRAVENOUS
  Administered 2023-12-04: 20 ug via INTRAVENOUS
  Administered 2023-12-04: 80 ug via INTRAVENOUS
  Administered 2023-12-04: 20 ug via INTRAVENOUS

## 2023-12-04 MED ORDER — PROPOFOL 10 MG/ML IV BOLUS
INTRAVENOUS | Status: AC
  Filled 2023-12-04: qty 20

## 2023-12-04 MED ORDER — PROPOFOL 10 MG/ML IV BOLUS
INTRAVENOUS | Status: DC | PRN
  Administered 2023-12-04: 100 mg via INTRAVENOUS

## 2023-12-04 MED ORDER — HYDROMORPHONE HCL 1 MG/ML IJ SOLN
INTRAMUSCULAR | Status: AC
  Filled 2023-12-04: qty 1

## 2023-12-04 MED ORDER — EPHEDRINE SULFATE (PRESSORS) 25 MG/5ML IV SOSY
PREFILLED_SYRINGE | INTRAVENOUS | Status: DC | PRN
  Administered 2023-12-04: 10 mg via INTRAVENOUS

## 2023-12-04 MED ORDER — ACETAMINOPHEN 10 MG/ML IV SOLN
INTRAVENOUS | Status: AC
  Filled 2023-12-04: qty 100

## 2023-12-04 MED ORDER — ONDANSETRON HCL 4 MG/2ML IJ SOLN: 4.0000 mg | Freq: Once | INTRAMUSCULAR | Status: DC | PRN

## 2023-12-04 MED ORDER — OXYCODONE HCL 5 MG PO TABS: 5.0000 mg | ORAL_TABLET | Freq: Once | ORAL | Status: DC | PRN

## 2023-12-04 MED ORDER — LIDOCAINE HCL (CARDIAC) PF 100 MG/5ML IV SOSY
PREFILLED_SYRINGE | INTRAVENOUS | Status: DC | PRN
  Administered 2023-12-04: 100 mg via INTRAVENOUS

## 2023-12-04 MED ORDER — FENTANYL CITRATE (PF) 100 MCG/2ML IJ SOLN
INTRAMUSCULAR | Status: AC
  Filled 2023-12-04: qty 2

## 2023-12-04 MED ORDER — IOHEXOL 300 MG/ML  SOLN
INTRAMUSCULAR | Status: DC | PRN
  Administered 2023-12-04: 20 mL

## 2023-12-04 MED ORDER — OXYCODONE HCL 5 MG/5ML PO SOLN: 5.0000 mg | Freq: Once | ORAL | Status: DC | PRN

## 2023-12-04 MED ORDER — PHENYLEPHRINE HCL-NACL 20-0.9 MG/250ML-% IV SOLN
INTRAVENOUS | Status: DC | PRN
  Administered 2023-12-04: 50 ug/min via INTRAVENOUS

## 2023-12-04 MED ORDER — PHENYLEPHRINE HCL (PRESSORS) 10 MG/ML IV SOLN: INTRAVENOUS | Status: DC | PRN

## 2023-12-04 MED ORDER — LACTATED RINGERS IV SOLN: INTRAVENOUS | Status: DC

## 2023-12-04 MED ORDER — CHLORHEXIDINE GLUCONATE 0.12 % MT SOLN
15.0000 mL | Freq: Once | OROMUCOSAL | Status: AC
  Administered 2023-12-04: 15 mL via OROMUCOSAL

## 2023-12-04 MED ORDER — ONDANSETRON HCL 4 MG/2ML IJ SOLN
INTRAMUSCULAR | Status: DC | PRN
  Administered 2023-12-04: 4 mg via INTRAVENOUS

## 2023-12-04 MED ORDER — ONDANSETRON HCL 4 MG/2ML IJ SOLN
INTRAMUSCULAR | Status: AC
  Filled 2023-12-04: qty 2

## 2023-12-04 MED ORDER — SODIUM CHLORIDE 0.9 % IR SOLN
Status: DC | PRN
  Administered 2023-12-04: 6000

## 2023-12-04 MED ORDER — SODIUM CHLORIDE 0.9 % IR SOLN
Status: DC | PRN
  Administered 2023-12-04: 6000 mL

## 2023-12-04 MED ORDER — EPHEDRINE 5 MG/ML INJ
INTRAVENOUS | Status: AC
  Filled 2023-12-04: qty 5

## 2023-12-04 MED ORDER — ORAL CARE MOUTH RINSE: 15.0000 mL | Freq: Once | OROMUCOSAL | Status: AC

## 2023-12-04 SURGICAL SUPPLY — 17 items
BAG URINE DRAIN 2000ML AR STRL (UROLOGICAL SUPPLIES) IMPLANT
BAG URO CATCHER STRL LF (MISCELLANEOUS) ×3 IMPLANT
CATH URETL OPEN END 6FR 70 (CATHETERS) IMPLANT
CLOTH BEACON ORANGE TIMEOUT ST (SAFETY) ×3 IMPLANT
DRAPE FOOT SWITCH (DRAPES) ×3 IMPLANT
ELECT REM PT RETURN 15FT ADLT (MISCELLANEOUS) ×3 IMPLANT
GLOVE SURG LX STRL 7.5 STRW (GLOVE) ×3 IMPLANT
GOWN STRL REUS W/ TWL XL LVL3 (GOWN DISPOSABLE) ×3 IMPLANT
GUIDEWIRE STR DUAL SENSOR (WIRE) ×3 IMPLANT
KIT TURNOVER KIT A (KITS) ×3 IMPLANT
LOOP CUT BIPOLAR 24F LRG (ELECTROSURGICAL) IMPLANT
MANIFOLD NEPTUNE II (INSTRUMENTS) ×3 IMPLANT
NS IRRIG 1000ML POUR BTL (IV SOLUTION) IMPLANT
PACK CYSTO (CUSTOM PROCEDURE TRAY) ×3 IMPLANT
SYRINGE TOOMEY IRRIG 70ML (MISCELLANEOUS) IMPLANT
TUBING CONNECTING 10 (TUBING) ×3 IMPLANT
TUBING UROLOGY SET (TUBING) ×3 IMPLANT

## 2023-12-04 NOTE — Anesthesia Preprocedure Evaluation (Addendum)
 Anesthesia Evaluation  Patient identified by MRN, date of birth, ID band Patient awake    Reviewed: Allergy & Precautions, NPO status , Patient's Chart, lab work & pertinent test results, reviewed documented beta blocker date and time   History of Anesthesia Complications Negative for: history of anesthetic complications  Airway Mallampati: II  TM Distance: >3 FB Neck ROM: Full    Dental  (+) Dental Advisory Given, Edentulous Upper, Edentulous Lower   Pulmonary    breath sounds clear to auscultation       Cardiovascular hypertension, Pt. on medications and Pt. on home beta blockers + CAD  + Valvular Problems/Murmurs AI  Rhythm:Regular Rate:Normal  TTE (03/2023): Left ventricular ejection fraction, by estimation, is 60 to 65%. Right ventricular systolic function is normal. Aortic valve regurgitation is moderate.     Neuro/Psych  Headaches PSYCHIATRIC DISORDERS Anxiety Depression       GI/Hepatic ,GERD  Medicated and Controlled,,  Endo/Other    Renal/GU Renal InsufficiencyRenal disease (Baseline Cr ~1.1, CrCl 40)   Bladder Cancer    Musculoskeletal   Abdominal   Peds  Hematology  (+) Blood dyscrasia, anemia Hgb 10.7, Plts 355K (12/03/23)   Anesthesia Other Findings   Reproductive/Obstetrics                              Anesthesia Physical Anesthesia Plan  ASA: 3  Anesthesia Plan: General   Post-op Pain Management:    Induction: Intravenous  PONV Risk Score and Plan: 3 and Ondansetron , Dexamethasone  and Treatment may vary due to age or medical condition  Airway Management Planned: Oral ETT  Additional Equipment:   Intra-op Plan:   Post-operative Plan: Extubation in OR  Informed Consent:      Dental advisory given  Plan Discussed with: CRNA  Anesthesia Plan Comments:          Anesthesia Quick Evaluation

## 2023-12-04 NOTE — Brief Op Note (Signed)
 12/04/2023  2:44 PM  PATIENT:  [name]  73 y.o. female  PRE-OPERATIVE DIAGNOSIS:  BLADDER CANCER  POST-OPERATIVE DIAGNOSIS:  BLADDER CANCER  PROCEDURE:  Procedure(s): TURBT (TRANSURETHRAL RESECTION OF BLADDER TUMOR) (N/A) CYSTOSCOPY, WITH RETROGRADE PYELOGRAM (Bilateral)  SURGEON:  Surgeons and Role:    * Manny, Ricardo KATHEE Raddle., MD - Primary  PHYSICIAN ASSISTANT:   ASSISTANTS: none   ANESTHESIA:   general  EBL:  minimal   BLOOD ADMINISTERED:none  DRAINS: none   LOCAL MEDICATIONS USED:  NONE  SPECIMEN:  Source of Specimen:  1 - bladder tumor; 2 base of bladder tumros  DISPOSITION OF SPECIMEN:  PATHOLOGY  COUNTS:  YES  TOURNIQUET:  * No tourniquets in log *  DICTATION: .Other Dictation: Dictation Number [id number]  PLAN OF CARE: Discharge to home after PACU  PATIENT DISPOSITION:  PACU - hemodynamically stable.   Delay start of Pharmacological VTE agent (>24hrs) due to surgical blood loss or risk of bleeding: yes

## 2023-12-04 NOTE — Op Note (Signed)
 [name], ACORD MEDICAL RECORD NO: [id number] ACCOUNT NO: [id number] DATE OF BIRTH: 1950-09-22 FACILITY: [facility] LOCATION: [facility] PHYSICIAN: Ricardo Likens, MD  Operative Report   DATE OF PROCEDURE: 12/04/2023  PREOPERATIVE DIAGNOSIS:  Very large bladder cancer with hemodynamically significant bleeding.  POSTOPERATIVE DIAGNOSIS: Very large bladder cancer with hemodynamically significant bleeding.  PROCEDURE PERFORMED: 1.  Cystoscopy, bilateral retrograde pyelograms interpretation. 2.  Transurethral resection of bladder tumor, volume large.  ESTIMATED BLOOD LOSS:  Nil.  COMPLICATIONS:  None.  SPECIMENS: 1. Large nodular bladder tumor fragments for permanent pathology. 2. Base of bladder tumor #1. 3.  Base of bladder tumor #2.  FINDINGS: 1. Unremarkable bilateral retrograde pyelogram. 2. Very large and nodular bladder tumor at least 10 cm square encompassing mostly the dome and left lateral wall areas, sparing ureteral orifices.  INDICATIONS: The patient is a pleasant 73 year old who was found on workup of gross hematuria with clots to have a very large bladder neoplasm by imaging with some questionable oligometastatic disease in the form of pelvic lymph nodes. She had a prior  admission for hematuria with clots and required transfusions; however, the acute bleeding stopped. She was discharged and referred for office followup. I met her recently. I reviewed imaging and recommended transurethral resection of the bladder tumor at  the next available with the goal of tissue typing, maximal debulking, and helping reduce her risk of recurrent hemodynamically significant bleeding. She wished to proceed. She presents for this today.  Informed consent was obtained and placed on the  medical record.  DESCRIPTION OF PROCEDURE: The patient being identified and verified and the procedure being transurethral resection of the bladder tumor with retrogrades was confirmed. Procedure timeout  was performed.  Intravenous antibiotics were administered.  General  LMA anesthesia was induced and the patient was placed into a low lithotomy position. A sterile field was created. Prepping and draped the patient's vagina, introitus and proximal thigh using iodine.  Cystourethroscopy was performed using 21-French rigid  cystoscope with offset lens.  Inspection of the urinary bladder revealed no diverticula, calcifications. There was a very large, somewhat nodular and sessile tumor with minimal papillary component mostly at the bladder dome and left lateral walls.  Fortunately, this was sparing the ureteral orifices. No active bleeding at the time. Attention was first directed at retrograde pyelograms. The left ureteral orifice was cannulated with a 6-French end-hole catheter and a left retrograde pyelogram was  obtained.  The left retrograde pyelogram demonstrated single left ureter, single system left kidney.  No filling defects or narrowing noted.  Next, the right retrograde pyelogram was obtained.  Right retrograde pyelogram demonstrated a single right ureter and a single system in the right kidney. No filling defects or narrowing noted. The cystoscope was then exchanged to a 26-French resectoscope sheath with a visual obturator. Using a  medium-sized resectoscope loop, very careful resection was performed down to the superficial fibromuscular stroma of the urinary bladder at all areas of tumor. Again, the resection area was quite large, at least 10 cm total surface area . This generated innumerable  bladder tumor fragments, which were irrigated and set aside for permanent pathology. The entire base of the dissection area was then fulgurated. This revealed an excellent hemostasis. There was no evidence of any bladder perforation. There clearly was  residual tumor covering a large swath of the bladder, and this is not endoscopically curable. I achieved the goals today of tissue sampling, debulking,  and fulguration of the main focus of bleeding. Next, cold  cup biopsy forceps were used to obtain  representative deep samples of what appeared to be the muscularis interface in two separate locations. These were set aside labeled as base of bladder tumor #1 and #2. These sites were then fulgurated. The bladder was once again inspected. There was no  evidence of perforation. Hemostasis was excellent. The bladder was partially emptied per cystoscope. Procedure was then terminated.  The patient tolerated the procedure well. No immediate periprocedural complications. The patient was taken to post  anesthesia care unit in stable condition with plans for discharge home after void.   PUS D: 12/04/2023 2:49:45 pm T: 12/04/2023 3:14:00 pm  JOB: [id number]/ [id number]

## 2023-12-04 NOTE — Anesthesia Postprocedure Evaluation (Signed)
 Anesthesia Post Note  Patient: [name]  Procedure(s) Performed: TURBT (TRANSURETHRAL RESECTION OF BLADDER TUMOR) (Bladder) CYSTOSCOPY, WITH RETROGRADE PYELOGRAM (Bilateral)     Patient location during evaluation: PACU Anesthesia Type: General Level of consciousness: awake Pain management: pain level controlled Vital Signs Assessment: post-procedure vital signs reviewed and stable Respiratory status: spontaneous breathing Cardiovascular status: blood pressure returned to baseline Postop Assessment: no apparent nausea or vomiting Anesthetic complications: no   No notable events documented.  Last Vitals:  Vitals:   12/04/23 1445 12/04/23 1500  BP: 123/75 125/67  Pulse: 64 (!) 59  Resp: 19 14  Temp:    SpO2: 97% 93%    Last Pain:  Vitals:   12/04/23 1500  TempSrc:   PainSc: Asleep                 Lauraine KATHEE [place]

## 2023-12-04 NOTE — Discharge Instructions (Signed)
1 - You may have urinary urgency (bladder spasms) and bloody urine on / off for up to 2 weeks.  This is normal.  2 - Call MD or go to ER for fever >102, severe pain / nausea / vomiting not relieved by medications, or acute change in medical status  

## 2023-12-04 NOTE — H&P (Signed)
 [name] is an 73 y.o. female.    Chief Complaint: Pre-OP Transurethral Resection of Bladder TUmor  HPI:   1 - Large Volume / Possibly Oligometastatic Bladder Cancer - huge volume multifocal bladder masses on CT 10/2023 on eval gross hematuria. Some shoddy (<2cm) pelvic adenopathy. Bilateral single ureters, no hydro, Cr 1.06, chest imaging pending.  PMH sig for CAD (follows [employer] cards, not limiting), c/s. Her PCP is Dr. Lunette  Today  [name]  is seen to proceed with TURBT / Retrogrades for large volume bladder cancer with intermitant significantg bleeding. Hgb 10.7, Cr 1 most recently.  Past Medical History:  Diagnosis Date   Abdominal hernia    Anemia    Anxiety    Aortic insufficiency    Moderate by echo 03/2023   Arthritis    CAD (coronary artery disease), native coronary artery    Coronary CTA showed coronary Ca score of 883 with severe total plaque volume and moderate stenosis of of the ostial and mid LAD and prox diag and occluded nondominant RCA.  FFR only mildly abnormal in the distal LAD and LCx.   Chronic back pain    Chronic headache    migraine     CKD stage 3a, GFR 45-59 ml/min (HCC) 11/14/2023   Closed right ankle fracture    Depression    GERD (gastroesophageal reflux disease)    Headache    migraine   Hypertension     Past Surgical History:  Procedure Laterality Date   CATARACT EXTRACTION W/ INTRAOCULAR LENS IMPLANT     CESAREAN SECTION  1983, 1985   x2   COLONOSCOPY     ORIF ANKLE FRACTURE Right 04/27/2019   Procedure: OPEN REDUCTION INTERNAL FIXATION (ORIF) ANKLE FRACTURE;  Surgeon: Sharl Selinda Dover, MD;  Location: [facility] OR;  Service: Orthopedics;  Laterality: Right;  2 hrs RNFA   WISDOM TOOTH EXTRACTION      Family History  Problem Relation Age of Onset   Heart Problems Mother    Brain cancer Father    Colon cancer Neg Hx    Stomach cancer Neg Hx    Esophageal cancer Neg Hx    Pancreatic cancer Neg Hx    Rectal cancer Neg Hx     Social History:  reports that she has never smoked. She has never used smokeless tobacco. She reports that she does not drink alcohol and does not use drugs.  Allergies:  Allergies  Allergen Reactions   Elemental Sulfur Nausea And Vomiting and Rash   Penicillins Rash    No medications prior to admission.    Results for orders placed or performed during the hospital encounter of 12/03/23 (from the past 48 hours)  Basic metabolic panel per protocol     Status: Abnormal   Collection Time: 12/03/23 10:23 AM  Result Value Ref Range   Sodium 142 135 - 145 mmol/L   Potassium 4.6 3.5 - 5.1 mmol/L   Chloride 111 98 - 111 mmol/L   CO2 22 22 - 32 mmol/L   Glucose, Bld 92 70 - 99 mg/dL    Comment: Glucose reference range applies only to samples taken after fasting for at least 8 hours.   BUN 20 8 - 23 mg/dL   Creatinine, Ser 8.90 (H) 0.44 - 1.00 mg/dL   Calcium  9.2 8.9 - 10.3 mg/dL   GFR, Estimated 53 (L) >60 mL/min    Comment: (NOTE) Calculated using the CKD-EPI Creatinine Equation (2021)    Anion gap 8  5 - 15    Comment: Performed at [facility], [address], [place], [place] [address]  CBC per protocol     Status: Abnormal   Collection Time: 12/03/23 10:23 AM  Result Value Ref Range   WBC 9.5 4.0 - 10.5 K/uL   RBC 3.39 (L) 3.87 - 5.11 MIL/uL   Hemoglobin 10.7 (L) 12.0 - 15.0 g/dL   HCT 66.4 (L) 63.9 - 53.9 %   MCV 98.8 80.0 - 100.0 fL   MCH 31.6 26.0 - 34.0 pg   MCHC 31.9 30.0 - 36.0 g/dL   RDW 86.8 88.4 - 84.4 %   Platelets 355 150 - 400 K/uL   nRBC 0.0 0.0 - 0.2 %    Comment: Performed at [facility], [address], [place], [place] [address]   No results found.  Review of Systems  Constitutional:  Negative for chills and fever.  Genitourinary:  Positive for hematuria.  All other systems reviewed and are negative.   There were no vitals taken for this visit. Physical Exam Vitals reviewed.  HENT:     Head:  Normocephalic.  Eyes:     Pupils: Pupils are equal, round, and reactive to light.  Cardiovascular:     Rate and Rhythm: Normal rate.  Pulmonary:     Effort: Pulmonary effort is normal.  Abdominal:     General: Abdomen is flat.  Genitourinary:    Comments: No CVAT at present Musculoskeletal:        General: Normal range of motion.     Cervical back: Normal range of motion.  Skin:    General: Skin is warm.  Neurological:     General: No focal deficit present.     Mental Status: She is alert.  Psychiatric:        Mood and Affect: Mood normal.      Assessment/Plan  Proceed as planned with TURBT / Retrogrades. Goals today of debulking, tissue DX, help reduce hematuria, may very well require additional therapy such as chemo/cystectomy. Risks, benefits, alternatives, expected peri-op course discussed previously and reiterated today.   Ricardo KATHEE Alvaro Mickey., MD 12/04/2023, 9:20 AM

## 2023-12-04 NOTE — Anesthesia Procedure Notes (Signed)
 Procedure Name: LMA Insertion Date/Time: 12/04/2023 1:09 PM  Performed by: Therisa Doyal CROME, CRNAPatient Re-evaluated:Patient Re-evaluated prior to induction Oxygen Delivery Method: Circle system utilized Preoxygenation: Pre-oxygenation with 100% oxygen Induction Type: IV induction LMA: LMA inserted LMA Size: 3.0 Number of attempts: 1 Placement Confirmation: positive ETCO2 and breath sounds checked- equal and bilateral Tube secured with: Tape Dental Injury: Teeth and Oropharynx as per pre-operative assessment

## 2023-12-04 NOTE — Transfer of Care (Signed)
 Immediate Anesthesia Transfer of Care Note  Patient: [name]  Procedure(s) Performed: TURBT (TRANSURETHRAL RESECTION OF BLADDER TUMOR) (Bladder) CYSTOSCOPY, WITH RETROGRADE PYELOGRAM (Bilateral)  Patient Location: PACU  Anesthesia Type:General  Level of Consciousness: drowsy  Airway & Oxygen Therapy: Patient Spontanous Breathing and Patient connected to face mask oxygen  Post-op Assessment: Report given to RN and Post -op Vital signs reviewed and stable  Post vital signs: Reviewed and stable  Last Vitals:  Vitals Value Taken Time  BP 142/68 12/04/23 14:13  Temp    Pulse 64 12/04/23 14:14  Resp 13 12/04/23 14:14  SpO2 100 % 12/04/23 14:14  Vitals shown include unfiled device data.  Last Pain:  Vitals:   12/04/23 1126  TempSrc: Oral         Complications: No notable events documented.

## 2023-12-05 ENCOUNTER — Encounter (HOSPITAL_COMMUNITY): Payer: Self-pay | Admitting: Urology

## 2023-12-08 ENCOUNTER — Ambulatory Visit: Attending: Cardiovascular Disease | Admitting: Pharmacist Clinician (PhC)/ Clinical Pharmacy Specialist

## 2023-12-08 ENCOUNTER — Encounter: Payer: Self-pay | Admitting: Pharmacist Clinician (PhC)/ Clinical Pharmacy Specialist

## 2023-12-08 DIAGNOSIS — E782 Mixed hyperlipidemia: Secondary | ICD-10-CM | POA: Insufficient documentation

## 2023-12-08 LAB — SURGICAL PATHOLOGY

## 2023-12-08 MED ORDER — EZETIMIBE 10 MG PO TABS: 10.0000 mg | ORAL_TABLET | Freq: Every day | ORAL | 3 refills | Status: AC

## 2023-12-08 NOTE — Patient Instructions (Addendum)
 Your Results:             Your most recent labs Goal  Total Cholesterol 158 < 200  Triglycerides 101 < 150  HDL (happy/good cholesterol) 61 > 40  LDL (lousy/bad cholesterol 79 < 70   Medication changes:  Start ezetimibe 10 mg daily, at the same time as your atorvastatin .    Lab orders:  We want to repeat labs after 2-3 months.  We will send you a lab order to remind you once we get closer to that time.    Thank you for choosing [facility]  Allean Mink PharmD

## 2023-12-08 NOTE — Progress Notes (Signed)
 Patient ID: [name]                 DOB: 1950/03/27                    MRN: [id number]      HPI: [name] is a 73 y.o. female patient referred to lipid clinic by Dr. Shlomo. PMH is significant for CAD (CAC score 883, 93rd percentile), HTN, CKD stage 3a, and recent procedure for removal of bladder mass (11/13).   Discussed with patient their risk for cardiovascular events given their past medical history and other findings. Informed them of significance of coronary artery calcium  scoring on cardiovascular risk.   Reviewed options for lowering LDL cholesterol, including ezetimibe, PCSK-9 inhibitors, and inclisiran. Discussed mechanisms of action, dosing, side effects and potential decreases in LDL cholesterol. Also reviewed cost information and potential options for patient assistance. Patient is not interested in getting injections.   Patient expressed interested in ezetimibe for additional LDL lowering. Discussed with patient the importance of taking both ezetimibe and atorvastatin  for optimizing LDL level.   Last lipid panel TC 158, HDL 61, TG 101, LDL 79 on 1/72/74.   Current Medications: atorvastatin  40 mg Intolerances: n/a  Risk Factors: CAD, HTN, CKD.  LDL goal: < 70   Diet: Varies. Eats out two to three times weekly.   Exercise: Walks dog and completes activities around house. Does not complete designated exercise.    Family History:  Family History  Problem Relation Age of Onset   Heart Problems Mother    Brain cancer Father    Colon cancer Neg Hx    Stomach cancer Neg Hx    Esophageal cancer Neg Hx    Pancreatic cancer Neg Hx    Rectal cancer Neg Hx     Social History:   Labs:  Lipid Panel     Component Value Date/Time   CHOL 158 09/17/2023 1004   TRIG 101 09/17/2023 1004   HDL 61 09/17/2023 1004   CHOLHDL 2.6 09/17/2023 1004   LDLCALC 79 09/17/2023 1004   LABVLDL 18 09/17/2023 1004    Past Medical History:  Diagnosis Date   Abdominal hernia     Anemia    Anxiety    Aortic insufficiency    Moderate by echo 03/2023   Arthritis    CAD (coronary artery disease), native coronary artery    Coronary CTA showed coronary Ca score of 883 with severe total plaque volume and moderate stenosis of of the ostial and mid LAD and prox diag and occluded nondominant RCA.  FFR only mildly abnormal in the distal LAD and LCx.   Chronic back pain    Chronic headache    migraine     CKD stage 3a, GFR 45-59 ml/min (HCC) 11/14/2023   Closed right ankle fracture    Depression    GERD (gastroesophageal reflux disease)    Headache    migraine   Hypertension     Current Outpatient Medications on File Prior to Visit  Medication Sig Dispense Refill   amLODipine (NORVASC) 5 MG tablet Take 5 mg by mouth in the morning.     [Paused] aspirin  EC 81 MG tablet Take 1 tablet (81 mg total) by mouth daily. Swallow whole.     atorvastatin  (LIPITOR) 40 MG tablet Take 1 tablet (40 mg total) by mouth daily. (Patient taking differently: Take 40 mg by mouth at bedtime.) 90 tablet 3   buPROPion (WELLBUTRIN XL) 300  MG 24 hr tablet Take 300 mg by mouth daily after breakfast.      Calcium  Carb-Cholecalciferol (CALCIUM  + D3 PO) Take 1 tablet by mouth in the morning.     Cholecalciferol (VITAMIN D3) 25 MCG (1000 UT) CAPS Take 1,000 Units by mouth daily.     FLUoxetine (PROZAC) 20 MG capsule Take 60 mg by mouth every morning.     hydrOXYzine (ATARAX) 10 MG tablet Take 10-30 mg by mouth at bedtime as needed (sleep/anxiety).     hyoscyamine (LEVSIN SL) 0.125 MG SL tablet Place 1 tablet (0.125 mg total) under the tongue every 6 (six) hours as needed for cramping (bladder spasm). 30 tablet 0   metoprolol  succinate (TOPROL  XL) 25 MG 24 hr tablet Take 1 tablet (25 mg total) by mouth daily. (Patient taking differently: Take 25 mg by mouth at bedtime.) 90 tablet 3   Multiple Vitamin (MULTIVITAMIN WITH MINERALS) TABS tablet Take 1 tablet by mouth in the morning.     omeprazole  (PRILOSEC) 20 MG capsule Take 20 mg by mouth daily before breakfast.      oxyCODONE  (OXY IR/ROXICODONE ) 5 MG immediate release tablet Take 1 tablet (5 mg total) by mouth every 6 (six) hours as needed for moderate pain (pain score 4-6) or severe pain (pain score 7-10). Post-operatively 15 tablet 0   prochlorperazine (COMPAZINE) 10 MG tablet Take 1 tablet (10 mg total) by mouth every 8 (eight) hours as needed for nausea or vomiting. 30 tablet 0   rizatriptan  (MAXALT ) 10 MG tablet Take 1 tablet (10 mg total) by mouth daily as needed. 10 tablet 5   senna-docusate (SENOKOT-S) 8.6-50 MG tablet Take 1 tablet by mouth 2 (two) times daily. While taking strong pain meds to prevent constipation. 10 tablet 0   topiramate  (TOPAMAX ) 100 MG tablet Take 1 tablet (100 mg total) by mouth at bedtime. 30 tablet 5   No current facility-administered medications on file prior to visit.    Allergies  Allergen Reactions   Elemental Sulfur Nausea And Vomiting and Rash   Penicillins Rash    Assessment/Plan:  1. Hyperlipidemia -  Problem  Mixed Hyperlipidemia   Last lipid panel TC 158, HDL 61, TG 101, LDL 79 on 1/72/74.   Current Medications: atorvastatin  40 mg Intolerances: n/a  Risk Factors: CAD, HTN, CKD.  LDL goal: < 70     Mixed hyperlipidemia Assessment:  LDL goal: < 70 mg/dl last LDLc 79 mg/dl (1/72/74) Tolerates atorvastatin  40 mg well without any side effects  Discussed next potential options (ezetimibe, PCSK-9 inhibitors and inclisiran); cost, dosing efficacy, side effects   Plan: Continue taking atorvastatin  40 mg  Start taking ezetimibe 10 mg once daily with food  Lipid lab due in 2-3 months after starting ezetimibe   Thank you,  Sitara Cashwell, PharmD Candidate   I was with student and patient for entire visit and agree with above assessment and plan.   Kristin Alvstad, Pharm.D [place] [facility]. [facility] [address], [place], [place]  [address] Phone: [phone]; Fax: [phone]

## 2023-12-08 NOTE — Assessment & Plan Note (Addendum)
 Assessment:  LDL goal: < 70 mg/dl last LDLc 79 mg/dl (1/72/74) Tolerates atorvastatin  40 mg well without any side effects  Discussed next potential options (ezetimibe, PCSK-9 inhibitors and inclisiran); cost, dosing efficacy, side effects   Plan: Continue taking atorvastatin  40 mg  Start taking ezetimibe 10 mg once daily with food  Lipid lab due in 2-3 months after starting ezetimibe

## 2023-12-22 DIAGNOSIS — C678 Malignant neoplasm of overlapping sites of bladder: Secondary | ICD-10-CM | POA: Diagnosis not present

## 2024-01-07 NOTE — Progress Notes (Signed)
 GU Location of Tumor / Histology: Bladder Ca  [facility] [name] presented as referral from Dr. Ricardo Likens [facility]).  11/14/2023 Dr. Selinda Sias US  Renal CLINICAL DATA:  Eval for hematuria.  IMPRESSION: 1. Findings suspicious for a bladder mass with asymmetric bladder wall thickening and intraluminal echogenic debris, correlating with the recent CT-identified bladder mass. 2. No signs of hydronephrosis or kidney mass bilaterally.   11/11/2023 Darice EMERSON Bitter, FNP CT Abdomen Pelvis with/without Contrast CLINICAL DATA:  Gross hematuria 1 week ago * Tracking Code: BO *   IMPRESSION: 1. Enhancing mass of the anterior bladder dome measuring 4.7 x 4.2 x 2.3 cm with adjacent fat stranding. Additional mass of the inferior left aspect of the bladder measuring 3.0 x 1.9 x 1.5 cm. Findings are consistent with multifocal bladder malignancy. 2. Prominent left external iliac lymph node measuring 1.0 x 0.7 cm, nonspecific although modestly suspicious for early nodal metastatic disease. No overtly enlarged lymph nodes in the abdomen or pelvis. 3. No evidence of renal mass or hydronephrosis. 4. Large paraesophageal hernia containing the entirety of the stomach. 5. Cholelithiasis. 6. Coronary artery disease.    Past/Anticipated interventions by urology, if any:  Dr. Ricardo Likens    Past/Anticipated interventions by medical oncology, if any:  NA  Weight changes, if any: {:18581}  Bowel/Bladder complaints, if any: {:18581}   Nausea/Vomiting, if any: {:18581}  Pain issues, if any:  {:18581}  SAFETY ISSUES: Prior radiation? {:18581} Pacemaker/ICD? {:18581} Possible current pregnancy? Female Is the patient on methotrexate? No  Current Complaints / other details:    30 minutes spent total, including time for meaningful use questions, reviewing medication, as well as spent in face-to-face time in nurse evaluation with the patient.

## 2024-01-08 ENCOUNTER — Ambulatory Visit
Admission: RE | Admit: 2024-01-08 | Discharge: 2024-01-08 | Disposition: A | Discharge status: Home / Self Care | Source: Ambulatory Visit | Attending: Radiation Oncology | Admitting: Radiation Oncology

## 2024-01-08 ENCOUNTER — Encounter: Payer: Self-pay | Admitting: Radiation Oncology

## 2024-01-08 ENCOUNTER — Ambulatory Visit: Admission: RE | Admit: 2024-01-08 | Discharge: 2024-01-08 | Attending: Radiation Oncology

## 2024-01-08 VITALS — BP 136/69 | HR 76 | Temp 97.7°F | Resp 18 | Ht 65.0 in | Wt 131.2 lb

## 2024-01-08 DIAGNOSIS — K219 Gastro-esophageal reflux disease without esophagitis: Secondary | ICD-10-CM | POA: Insufficient documentation

## 2024-01-08 DIAGNOSIS — Z7982 Long term (current) use of aspirin: Secondary | ICD-10-CM | POA: Insufficient documentation

## 2024-01-08 DIAGNOSIS — M549 Dorsalgia, unspecified: Secondary | ICD-10-CM | POA: Diagnosis not present

## 2024-01-08 DIAGNOSIS — Z79899 Other long term (current) drug therapy: Secondary | ICD-10-CM | POA: Insufficient documentation

## 2024-01-08 DIAGNOSIS — C678 Malignant neoplasm of overlapping sites of bladder: Secondary | ICD-10-CM | POA: Insufficient documentation

## 2024-01-08 DIAGNOSIS — K469 Unspecified abdominal hernia without obstruction or gangrene: Secondary | ICD-10-CM | POA: Diagnosis not present

## 2024-01-08 DIAGNOSIS — I2510 Atherosclerotic heart disease of native coronary artery without angina pectoris: Secondary | ICD-10-CM | POA: Diagnosis not present

## 2024-01-08 DIAGNOSIS — M1990 Unspecified osteoarthritis, unspecified site: Secondary | ICD-10-CM | POA: Diagnosis not present

## 2024-01-08 DIAGNOSIS — I351 Nonrheumatic aortic (valve) insufficiency: Secondary | ICD-10-CM | POA: Insufficient documentation

## 2024-01-08 DIAGNOSIS — I129 Hypertensive chronic kidney disease with stage 1 through stage 4 chronic kidney disease, or unspecified chronic kidney disease: Secondary | ICD-10-CM | POA: Diagnosis not present

## 2024-01-08 DIAGNOSIS — N1831 Chronic kidney disease, stage 3a: Secondary | ICD-10-CM | POA: Diagnosis not present

## 2024-01-08 DIAGNOSIS — Z808 Family history of malignant neoplasm of other organs or systems: Secondary | ICD-10-CM | POA: Diagnosis not present

## 2024-01-08 NOTE — Progress Notes (Signed)
 Radiation Oncology         [phone] ________________________________  Initial outpatient Consultation  Name: [name] MRN: [id number]  Date of Service: 01/08/2024 DOB: July 10, 1950  RR:Yldjpw, Ardell, MD  Alvaro Ricardo KATHEE Raddle., MD   REFERRING PHYSICIAN: Alvaro Ricardo KATHEE Raddle., MD  DIAGNOSIS: 73 y/o woman with large volume, highly aggressive variant, muscle invasive bladder cancer    ICD-10-CM   1. Malignant neoplasm of overlapping sites of bladder (HCC)  C67.8     2. Cancer of overlapping sites of bladder [facility])  C67.8       HISTORY OF PRESENT ILLNESS: [name] is a 73 y.o. female seen at the request of Dr. Alvaro.  She developed gross hematuria in October 2025 and her PCP ordered a CT A/P for further evaluation which was performed on 11/11/2023 showing 2 large bladder tumors (4.7 cm anterior bladder dome and a 3 cm inferior left lateral wall) with prominent left external iliac lymph node.  She was referred for outpatient urology evaluation but went into clot retention prompting evaluation in the emergency department on 12/15/2023 where she had a Foley catheter placed for continuous bladder irrigation.  The gross hematuria improved and she was discharged home with a Foley catheter in place on 11/18/2023.  She had an outpatient consultation with Dr. Alvaro on 11/25/2023 and proceeded with TURBT on 12/04/2023 where he removed a large volume of tumor, approximately 10 cm, but clearly residual tumor remained.  Final surgical pathology confirmed high-grade infiltrating urothelial carcinoma with adenocarcinoma signet ring cell component of 70%, with involvement of the detrusor muscle.  She reviewed the pathology results with her urologist and based on the aggressive nature of her variant, she has been kindly referred to us  today to discuss the potential role for palliative bladder radiation. She is adamantly not interested in cystectomy.  PREVIOUS RADIATION THERAPY: No  PAST MEDICAL  HISTORY:  Past Medical History:  Diagnosis Date   Abdominal hernia    Anemia    Anxiety    Aortic insufficiency    Moderate by echo 03/2023   Arthritis    CAD (coronary artery disease), native coronary artery    Coronary CTA showed coronary Ca score of 883 with severe total plaque volume and moderate stenosis of of the ostial and mid LAD and prox diag and occluded nondominant RCA.  FFR only mildly abnormal in the distal LAD and LCx.   Cancer [facility])    Chronic back pain    Chronic headache    migraine     CKD stage 3a, GFR 45-59 ml/min (HCC) 11/14/2023   Closed right ankle fracture    Depression    GERD (gastroesophageal reflux disease)    Headache    migraine   Hypertension       PAST SURGICAL HISTORY: Past Surgical History:  Procedure Laterality Date   CATARACT EXTRACTION W/ INTRAOCULAR LENS IMPLANT     CESAREAN SECTION  1983, 1985   x2   COLONOSCOPY     CYSTOSCOPY W/ RETROGRADES Bilateral 12/04/2023   Procedure: CYSTOSCOPY, WITH RETROGRADE PYELOGRAM;  Surgeon: Alvaro Ricardo KATHEE Raddle., MD;  Location: [facility];  Service: Urology;  Laterality: Bilateral;   ORIF ANKLE FRACTURE Right 04/27/2019   Procedure: OPEN REDUCTION INTERNAL FIXATION (ORIF) ANKLE FRACTURE;  Surgeon: Sharl Selinda Dover, MD;  Location: [facility] OR;  Service: Orthopedics;  Laterality: Right;  2 hrs RNFA   TRANSURETHRAL RESECTION OF BLADDER TUMOR N/A 12/04/2023   Procedure: TURBT (TRANSURETHRAL RESECTION OF BLADDER  TUMOR);  Surgeon: Alvaro Ricardo KATHEE Mickey., MD;  Location: [facility];  Service: Urology;  Laterality: N/A;   WISDOM TOOTH EXTRACTION      FAMILY HISTORY:  Family History  Problem Relation Age of Onset   Heart Problems Mother    Brain cancer Father    Colon cancer Neg Hx    Stomach cancer Neg Hx    Esophageal cancer Neg Hx    Pancreatic cancer Neg Hx    Rectal cancer Neg Hx     SOCIAL HISTORY:  Social History   Socioeconomic History   Marital status: Widowed    Spouse name: Not on file   Number of  children: 2   Years of education: 12th   Highest education level: Not on file  Occupational History   Occupation: Retired  Tobacco Use   Smoking status: Never   Smokeless tobacco: Never  Vaping Use   Vaping status: Never Used  Substance and Sexual Activity   Alcohol use: No   Drug use: No   Sexual activity: Not on file  Other Topics Concern   Not on file  Social History Narrative   Patient lives at home alone.   Caffeine Use: 1 soda daily   Social Drivers of Health   Tobacco Use: Low Risk (01/08/2024)   Patient History    Smoking Tobacco Use: Never    Smokeless Tobacco Use: Never    Passive Exposure: Not on file  Financial Resource Strain: Not on file  Food Insecurity: No Food Insecurity (11/14/2023)   Epic    Worried About Programme Researcher, Broadcasting/film/video in the Last Year: Never true    Ran Out of Food in the Last Year: Never true  Transportation Needs: No Transportation Needs (11/14/2023)   Epic    Lack of Transportation (Medical): No    Lack of Transportation (Non-Medical): No  Physical Activity: Not on file  Stress: Not on file  Social Connections: Moderately Isolated (11/14/2023)   Social Connection and Isolation Panel    Frequency of Communication with Friends and Family: Three times a week    Frequency of Social Gatherings with Friends and Family: Three times a week    Attends Religious Services: Never    Active Member of Clubs or Organizations: No    Attends Banker Meetings: Never    Marital Status: Married  Catering Manager Violence: Not At Risk (11/14/2023)   Epic    Fear of Current or Ex-Partner: No    Emotionally Abused: No    Physically Abused: No    Sexually Abused: No  Depression (PHQ2-9): Not on file  Alcohol Screen: Not on file  Housing: Low Risk (11/14/2023)   Epic    Unable to Pay for Housing in the Last Year: No    Number of Times Moved in the Last Year: 0    Homeless in the Last Year: No  Utilities: Not At Risk (11/14/2023)   Epic     Threatened with loss of utilities: No  Health Literacy: Not on file    ALLERGIES: Elemental sulfur and Penicillins  MEDICATIONS:  Current Outpatient Medications  Medication Sig Dispense Refill   amLODipine (NORVASC) 5 MG tablet Take 5 mg by mouth in the morning.     [Paused] aspirin  EC 81 MG tablet Take 1 tablet (81 mg total) by mouth daily. Swallow whole.     atorvastatin  (LIPITOR) 40 MG tablet Take 1 tablet (40 mg total) by mouth daily. (Patient taking differently: Take 40  mg by mouth at bedtime.) 90 tablet 3   buPROPion  (WELLBUTRIN  XL) 300 MG 24 hr tablet Take 300 mg by mouth daily after breakfast.      Calcium  Carb-Cholecalciferol (CALCIUM  + D3 PO) Take 1 tablet by mouth in the morning.     Cholecalciferol (VITAMIN D3) 25 MCG (1000 UT) CAPS Take 1,000 Units by mouth daily.     ezetimibe  (ZETIA ) 10 MG tablet Take 1 tablet (10 mg total) by mouth daily. 90 tablet 3   FLUoxetine  (PROZAC ) 20 MG capsule Take 60 mg by mouth every morning.     hydrOXYzine (ATARAX) 10 MG tablet Take 10-30 mg by mouth at bedtime as needed (sleep/anxiety).     metoprolol  succinate (TOPROL  XL) 25 MG 24 hr tablet Take 1 tablet (25 mg total) by mouth daily. (Patient taking differently: Take 25 mg by mouth at bedtime.) 90 tablet 3   Multiple Vitamin (MULTIVITAMIN WITH MINERALS) TABS tablet Take 1 tablet by mouth in the morning.     omeprazole (PRILOSEC) 20 MG capsule Take 20 mg by mouth daily before breakfast.      rizatriptan  (MAXALT ) 10 MG tablet Take 1 tablet (10 mg total) by mouth daily as needed. 10 tablet 5   senna-docusate (SENOKOT-S) 8.6-50 MG tablet Take 1 tablet by mouth 2 (two) times daily. While taking strong pain meds to prevent constipation. 10 tablet 0   topiramate  (TOPAMAX ) 100 MG tablet Take 1 tablet (100 mg total) by mouth at bedtime. 30 tablet 5   No current facility-administered medications for this encounter.    REVIEW OF SYSTEMS:  On review of systems, the patient reports that she is doing well  overall. She denies any chest pain, shortness of breath, cough, fevers, chills, night sweats, or recent unintended weight changes. She denies any bowel or bladder disturbances, and denies abdominal pain, nausea or vomiting. She has not had any recent gross hematuria since the time of her TURBT procedure and denies dysuria, incomplete emptying or incontinence. She denies any new musculoskeletal or joint aches or pains. A complete review of systems is obtained and is otherwise negative.    PHYSICAL EXAM:  Wt Readings from Last 3 Encounters:  01/08/24 131 lb 4 oz (59.5 kg)  12/04/23 132 lb 6.4 oz (60.1 kg)  12/03/23 132 lb 6.4 oz (60.1 kg)   Temp Readings from Last 3 Encounters:  01/08/24 97.7 F (36.5 C) (Temporal)  12/04/23 97.6 F (36.4 C)  12/03/23 97.8 F (36.6 C) (Oral)   BP Readings from Last 3 Encounters:  01/08/24 136/69  12/04/23 118/66  12/03/23 126/61   Pulse Readings from Last 3 Encounters:  01/08/24 76  12/04/23 (!) 57  12/03/23 61    /10  In general this is a well appearing Caucasian woman in no acute distress. She's alert and oriented x4 and appropriate throughout the examination. Cardiopulmonary assessment is negative for acute distress and she exhibits normal effort.   KPS = 90  100 - Normal; no complaints; no evidence of disease. 90   - Able to carry on normal activity; minor signs or symptoms of disease. 80   - Normal activity with effort; some signs or symptoms of disease. 56   - Cares for self; unable to carry on normal activity or to do active work. 60   - Requires occasional assistance, but is able to care for most of his personal needs. 50   - Requires considerable assistance and frequent medical care. 40   - Disabled; requires special  care and assistance. 30   - Severely disabled; hospital admission is indicated although death not imminent. 20   - Very sick; hospital admission necessary; active supportive treatment necessary. 10   - Moribund; fatal  processes progressing rapidly. 0     - Dead  Karnofsky DA, Abelmann WH, Craver LS and Burchenal [facility] [phone]) The use of the nitrogen mustards in the palliative treatment of carcinoma: with particular reference to bronchogenic carcinoma Cancer 1 634-56  LABORATORY DATA:  Lab Results  Component Value Date   WBC 9.5 12/03/2023   HGB 10.7 (L) 12/03/2023   HCT 33.5 (L) 12/03/2023   MCV 98.8 12/03/2023   PLT 355 12/03/2023   Lab Results  Component Value Date   NA 142 12/03/2023   K 4.6 12/03/2023   CL 111 12/03/2023   CO2 22 12/03/2023   Lab Results  Component Value Date   ALT 43 11/13/2023   AST 58 (H) 11/13/2023   ALKPHOS 103 11/13/2023   BILITOT 0.5 11/13/2023     RADIOGRAPHY: No results found.    IMPRESSION/PLAN: 1. 73 y.o. woman with large volume, highly aggressive variant muscle invasive bladder cancer.  Today, we talked to the patient and her significant other, Sudie, about the findings and workup thus far. We discussed the natural history of muscle-invasive bladder cancer and general treatment, highlighting the role of radiotherapy in the management. We discussed the available radiation techniques, and focused on the details and logistics of delivery. The recommendation is for a 7 week course of daily external beam radiation concurrent with radiosensitizing chemotherapy for a definitive, bladder sparing approach to treatment. She understands that this is an aggressive variant of bladder cancer with a propensity to metastasize but at present, it appears to be locally advanced only, involving a solitary pelvic lymph node on CT A/P. She understands that the likelihood for cure may be as low as 30% - 40% but given her good performance status and desire to be aggressive with treatment, concurrent chemoradiation is warranted and at minimum, would provide more durable disease control than radiation alone. We reviewed the anticipated acute and late sequelae associated with radiation in  this setting. The patient was encouraged to ask questions that were answered to her stated satisfaction.  At the conclusion of our conversation, she is interested in meeting with Dr. Tina, in medical oncology, to get his expert opinion on systemic therapy options. If he is in agreement, she would like to proceed with concurrent chemoradiation as soon as possible. She has freely signed written consent to proceed today in the office and a copy of this document will be placed in her medical record. Once we have input from Dr. Tina, we will move forward with treatment planning accordingly, in anticipation of beginning her treatments in the near future. We enjoyed meeting her and her significant other today and look forward to continuing to participate in her care.  We personally spent 60 minutes in this encounter including chart review, reviewing radiological studies, meeting face-to-face with the patient, entering orders and completing documentation.    Sabra MICAEL Rusk, PA-C    Donnice Barge, MD  [facility]  Radiation Oncology Direct Dial: [phone]  Fax: [phone] [email or web address]  Skype  LinkedIn

## 2024-01-09 NOTE — Progress Notes (Unsigned)
 [facility] CONSULT NOTE  Patient Care Team: Ransom Other, MD as PCP - General (Internal Medicine)  ASSESSMENT & PLAN:  [name] is a 73 y.o.female with history of CAD, HTN, HLD, CKD3b being [facility] bladder cancer.  Last CrCl 41.   CT showed enhancing mass in the anterior bladder dome 4.7 x 4.2 x 2.3 centimeter, inferior left aspect of bladder 3 x 1.9 x 1.5 cm.  Prominent left external iliac 1 x 0.7 cm.  Pathology showed T2 high-grade UC with adenocarcinoma signet ring cell (70%).  Discussed her bladder cancer pathology. Patients with signet ring adenocarcinoma present with higher rates of muscle-invasive disease, lymph node metastasis, and distant metastasis compared to conventional urothelial carcinoma, and experience significantly worse overall and cancer-specific survival. Front Oncol. 2020 May 15:10:653.  Options including neoadjuvant EV/P followed by surgery, surgery followed by adjuvant treatment guided by pathology, or chemoradiation. For muscle-invasive disease, bladder preservation with concurrent chemoradiotherapy is an established treatment option, particularly for patients who are not surgical candidates or who decline cystectomy.  She is not ideal cisplatin candidate so cisplatin based neoadjuvant therapy or CRT would not be the choice.   We discussed proceed with PET staging.   If minimal locoregional nodal disease, RC followed by radiation probably better option.  If findings of significant locoregional nodal disease, radiation with chemotherapy understanding data on variant histology is limited.  If more than locoregional disease or signs of distant metastases, then EV/P will be recommended.  Assessment & Plan   No orders of the defined types were placed in this encounter.   I personally spent a total of *** minutes in the care of the patient today including {Time Based Coding:[id number]}.   All questions were answered. The  patient knows to call the clinic with any problems, questions or concerns. No barriers to learning was detected.  Pauletta JAYSON Chihuahua, MD 12/21/202510:35 AM  CHIEF COMPLAINTS/PURPOSE OF CONSULTATION:  ***  HISTORY OF PRESENTING ILLNESS:  [name] 73 y.o. female is here because of *** I have reviewed her chart and materials related to her cancer extensively and collaborated history with the patient. Summary of oncologic history is as follows:  Patient seen Dr. Patrcia after findings of hematuria.  On 11/11/2023 report of CT showed 2 large bladder tumor.  Prominent left external iliac lymph node 1.0 x 0.7 cm identified.  She presented to ED on 11/24 needed a Foley placed and continue bladder irrigation.  She had TURBT on 11/13 and pathology show large nodular bladder tumor.  Infiltrating high-grade urothelial carcinoma with adenocarcinoma signet ring cells 70% of component.  Bladder base 1 tumor was high-grade UC involving muscularis propria.  Base to the tumor was benign.  Oncology History  Cancer of overlapping sites of bladder (HCC)  11/11/2023 Imaging   CT A/P Prominent left external iliac lymph node measuring 1.0 x 0.7 cm (series 9, image 74) Enhancing mass of the anterior bladder dome measuring 4.7 x 4.2 x 2.3 cm (series 9, image 77, series 14, image 90). Additional mass of the inferior left aspect of the bladder measuring 3.0 x 1.9 x 1.5 cm (series 9, image 82, series 14, image 92). Adrenals/Urinary Tract: Adrenal glands are unremarkable. Kidneys are normal, without renal calculi, solid lesion, or hydronephrosis.   12/04/2023 Pathology Results   A. LARGE NODULAR BLADDER TUMOR, TURBT:  Infiltrating high grade urothelial carcinoma with adenocarcinoma signet ring cell (70%) component  The carcinoma invades muscularis propria (detrusor muscle)  Urothelial carcinoma in situ is present   B. BLADDER TUMOR, BASE 1, TURBT:  Infiltrating high grade urothelial carcinoma involving muscularis  propria   C. BLADDER TUMOR, BASE 2, TURBT: Benign submucosa with lymphocytic infiltration    01/08/2024 Initial Diagnosis   Cancer of overlapping sites of bladder [facility])  She developed gross hematuria in October 2025 and her PCP ordered a CT A/P for further evaluation which was performed on 11/11/2023 showing 2 large bladder tumors (4.7 cm anterior bladder dome and a 3 cm inferior left lateral wall) with prominent left external iliac lymph node.   11/25/23 consultation with Dr. Alvaro TURBT on 12/04/2023 where he removed a large volume of tumor, approximately 10 cm, but clearly residual tumor remained.    Final surgical pathology confirmed high-grade infiltrating urothelial carcinoma with adenocarcinoma signet ring cell component of 70%, with involvement of the detrusor muscle.   She is adamantly not interested in cystectomy. So was referred to Rad onc for evaluation.     MEDICAL HISTORY:  Past Medical History:  Diagnosis Date   Abdominal hernia    Anemia    Anxiety    Aortic insufficiency    Moderate by echo 03/2023   Arthritis    CAD (coronary artery disease), native coronary artery    Coronary CTA showed coronary Ca score of 883 with severe total plaque volume and moderate stenosis of of the ostial and mid LAD and prox diag and occluded nondominant RCA.  FFR only mildly abnormal in the distal LAD and LCx.   Cancer [facility])    Chronic back pain    Chronic headache    migraine     CKD stage 3a, GFR 45-59 ml/min (HCC) 11/14/2023   Closed right ankle fracture    Depression    GERD (gastroesophageal reflux disease)    Headache    migraine   Hypertension     SURGICAL HISTORY: Past Surgical History:  Procedure Laterality Date   CATARACT EXTRACTION W/ INTRAOCULAR LENS IMPLANT     CESAREAN SECTION  1983, 1985   x2   COLONOSCOPY     CYSTOSCOPY W/ RETROGRADES Bilateral 12/04/2023   Procedure: CYSTOSCOPY, WITH RETROGRADE PYELOGRAM;  Surgeon: Alvaro Ricardo KATHEE Mickey., MD;  Location: [facility]  ORS;  Service: Urology;  Laterality: Bilateral;   ORIF ANKLE FRACTURE Right 04/27/2019   Procedure: OPEN REDUCTION INTERNAL FIXATION (ORIF) ANKLE FRACTURE;  Surgeon: Sharl Selinda Dover, MD;  Location: [facility] OR;  Service: Orthopedics;  Laterality: Right;  2 hrs RNFA   TRANSURETHRAL RESECTION OF BLADDER TUMOR N/A 12/04/2023   Procedure: TURBT (TRANSURETHRAL RESECTION OF BLADDER TUMOR);  Surgeon: Alvaro Ricardo KATHEE Mickey., MD;  Location: [facility] ORS;  Service: Urology;  Laterality: N/A;   WISDOM TOOTH EXTRACTION      SOCIAL HISTORY: Social History   Socioeconomic History   Marital status: Widowed    Spouse name: Not on file   Number of children: 2   Years of education: 12th   Highest education level: Not on file  Occupational History   Occupation: Retired  Tobacco Use   Smoking status: Never   Smokeless tobacco: Never  Vaping Use   Vaping status: Never Used  Substance and Sexual Activity   Alcohol use: No   Drug use: No   Sexual activity: Not on file  Other Topics Concern   Not on file  Social History Narrative   Patient lives at home alone.   Caffeine Use: 1 soda daily   Social Drivers of Health  Tobacco Use: Low Risk (01/08/2024)   Patient History    Smoking Tobacco Use: Never    Smokeless Tobacco Use: Never    Passive Exposure: Not on file  Financial Resource Strain: Not on file  Food Insecurity: No Food Insecurity (01/08/2024)   Epic    Worried About Programme Researcher, Broadcasting/film/video in the Last Year: Never true    Ran Out of Food in the Last Year: Never true  Transportation Needs: No Transportation Needs (01/08/2024)   Epic    Lack of Transportation (Medical): No    Lack of Transportation (Non-Medical): No  Physical Activity: Not on file  Stress: Not on file  Social Connections: Moderately Isolated (11/14/2023)   Social Connection and Isolation Panel    Frequency of Communication with Friends and Family: Three times a week    Frequency of Social Gatherings with Friends and Family:  Three times a week    Attends Religious Services: Never    Active Member of Clubs or Organizations: No    Attends Banker Meetings: Never    Marital Status: Married  Catering Manager Violence: Not At Risk (01/08/2024)   Epic    Fear of Current or Ex-Partner: No    Emotionally Abused: No    Physically Abused: No    Sexually Abused: No  Depression (PHQ2-9): Low Risk (01/08/2024)   Depression (PHQ2-9)    PHQ-2 Score: 0  Alcohol Screen: Low Risk (01/08/2024)   Alcohol Screen    Last Alcohol Screening Score (AUDIT): 0  Housing: Low Risk (01/08/2024)   Epic    Unable to Pay for Housing in the Last Year: No    Number of Times Moved in the Last Year: 0    Homeless in the Last Year: No  Utilities: Not At Risk (01/08/2024)   Epic    Threatened with loss of utilities: No  Health Literacy: Not on file    FAMILY HISTORY: Family History  Problem Relation Age of Onset   Heart Problems Mother    Brain cancer Father    Colon cancer Neg Hx    Stomach cancer Neg Hx    Esophageal cancer Neg Hx    Pancreatic cancer Neg Hx    Rectal cancer Neg Hx     ALLERGIES:  is allergic to elemental sulfur and penicillins.  MEDICATIONS:  Current Outpatient Medications  Medication Sig Dispense Refill   amLODipine (NORVASC) 5 MG tablet Take 5 mg by mouth in the morning.     [Paused] aspirin  EC 81 MG tablet Take 1 tablet (81 mg total) by mouth daily. Swallow whole.     atorvastatin  (LIPITOR) 40 MG tablet Take 1 tablet (40 mg total) by mouth daily. (Patient taking differently: Take 40 mg by mouth at bedtime.) 90 tablet 3   buPROPion  (WELLBUTRIN  XL) 300 MG 24 hr tablet Take 300 mg by mouth daily after breakfast.      Calcium  Carb-Cholecalciferol (CALCIUM  + D3 PO) Take 1 tablet by mouth in the morning.     Cholecalciferol (VITAMIN D3) 25 MCG (1000 UT) CAPS Take 1,000 Units by mouth daily.     ezetimibe  (ZETIA ) 10 MG tablet Take 1 tablet (10 mg total) by mouth daily. 90 tablet 3   FLUoxetine   (PROZAC ) 20 MG capsule Take 60 mg by mouth every morning.     hydrOXYzine (ATARAX) 10 MG tablet Take 10-30 mg by mouth at bedtime as needed (sleep/anxiety).     metoprolol  succinate (TOPROL  XL) 25 MG 24 hr tablet  Take 1 tablet (25 mg total) by mouth daily. (Patient taking differently: Take 25 mg by mouth at bedtime.) 90 tablet 3   Multiple Vitamin (MULTIVITAMIN WITH MINERALS) TABS tablet Take 1 tablet by mouth in the morning.     omeprazole (PRILOSEC) 20 MG capsule Take 20 mg by mouth daily before breakfast.      rizatriptan  (MAXALT ) 10 MG tablet Take 1 tablet (10 mg total) by mouth daily as needed. 10 tablet 5   senna-docusate (SENOKOT-S) 8.6-50 MG tablet Take 1 tablet by mouth 2 (two) times daily. While taking strong pain meds to prevent constipation. 10 tablet 0   topiramate  (TOPAMAX ) 100 MG tablet Take 1 tablet (100 mg total) by mouth at bedtime. 30 tablet 5   No current facility-administered medications for this visit.    REVIEW OF SYSTEMS:   All relevant systems were reviewed with the patient and are negative.  PHYSICAL EXAMINATION: ECOG PERFORMANCE STATUS: {CHL ONC ECOG [phone]}  There were no vitals filed for this visit. There were no vitals filed for this visit.  GENERAL: alert, no distress and comfortable SKIN: skin color is normal, no jaundice, rashes or significant lesions EYES: sclera clear OROPHARYNX: no exudate, no erythema NECK: supple LYMPH:  no palpable lymphadenopathy in the cervical, axillary regions LUNGS: Effort normal, no respiratory distress.  Clear to auscultation bilaterally HEART: regular rate & rhythm and no lower extremity edema ABDOMEN: soft, non-tender and nondistended Musculoskeletal: no edema NEURO: no focal motor/sensory deficits  LABORATORY DATA:  I have reviewed the data as listed Lab Results  Component Value Date   WBC 9.5 12/03/2023   HGB 10.7 (L) 12/03/2023   HCT 33.5 (L) 12/03/2023   MCV 98.8 12/03/2023   PLT 355 12/03/2023    Recent Labs    07/17/23 0957 09/17/23 1003 11/13/23 2010 11/14/23 0407 11/17/23 0208 11/18/23 0506 12/03/23 1023  NA  --   --  141   < > 140 144 142  K  --   --  3.8   < > 3.7 3.9 4.6  CL  --   --  112*   < > 111 113* 111  CO2  --   --  19*   < > 21* 22 22  GLUCOSE  --   --  116*   < > 106* 109* 92  BUN  --   --  21   < > 9 9 20   CREATININE  --   --  1.35*   < > 1.00 1.06* 1.09*  CALCIUM   --   --  9.4   < > 8.1* 8.7* 9.2  GFRNONAA  --   --  41*   < > 58* 55* 53*  PROT  --   --  6.8  --   --   --   --   ALBUMIN  --   --  3.7  --   --   --   --   AST  --   --  58*  --   --   --   --   ALT 28 38* 43  --   --   --   --   ALKPHOS  --   --  103  --   --   --   --   BILITOT  --   --  0.5  --   --   --   --    < > = values in this interval not displayed.  RADIOGRAPHIC STUDIES: I have personally reviewed the radiological images as listed and agreed with the findings in the report. No results found.

## 2024-01-12 ENCOUNTER — Inpatient Hospital Stay

## 2024-01-12 ENCOUNTER — Telehealth: Payer: Self-pay

## 2024-01-12 VITALS — BP 132/61 | HR 61 | Temp 98.2°F | Resp 16 | Ht 65.0 in | Wt 131.6 lb

## 2024-01-12 DIAGNOSIS — D649 Anemia, unspecified: Secondary | ICD-10-CM | POA: Insufficient documentation

## 2024-01-12 DIAGNOSIS — N1832 Chronic kidney disease, stage 3b: Secondary | ICD-10-CM | POA: Diagnosis not present

## 2024-01-12 DIAGNOSIS — I2510 Atherosclerotic heart disease of native coronary artery without angina pectoris: Secondary | ICD-10-CM | POA: Insufficient documentation

## 2024-01-12 DIAGNOSIS — M129 Arthropathy, unspecified: Secondary | ICD-10-CM | POA: Diagnosis not present

## 2024-01-12 DIAGNOSIS — C778 Secondary and unspecified malignant neoplasm of lymph nodes of multiple regions: Secondary | ICD-10-CM | POA: Diagnosis not present

## 2024-01-12 DIAGNOSIS — E785 Hyperlipidemia, unspecified: Secondary | ICD-10-CM | POA: Insufficient documentation

## 2024-01-12 DIAGNOSIS — Z79899 Other long term (current) drug therapy: Secondary | ICD-10-CM | POA: Insufficient documentation

## 2024-01-12 DIAGNOSIS — C678 Malignant neoplasm of overlapping sites of bladder: Secondary | ICD-10-CM | POA: Insufficient documentation

## 2024-01-12 DIAGNOSIS — K219 Gastro-esophageal reflux disease without esophagitis: Secondary | ICD-10-CM | POA: Diagnosis not present

## 2024-01-12 DIAGNOSIS — K449 Diaphragmatic hernia without obstruction or gangrene: Secondary | ICD-10-CM | POA: Diagnosis not present

## 2024-01-12 DIAGNOSIS — I351 Nonrheumatic aortic (valve) insufficiency: Secondary | ICD-10-CM | POA: Diagnosis not present

## 2024-01-12 DIAGNOSIS — Z7982 Long term (current) use of aspirin: Secondary | ICD-10-CM | POA: Diagnosis not present

## 2024-01-12 DIAGNOSIS — I129 Hypertensive chronic kidney disease with stage 1 through stage 4 chronic kidney disease, or unspecified chronic kidney disease: Secondary | ICD-10-CM | POA: Diagnosis not present

## 2024-01-12 LAB — CMP (CANCER CENTER ONLY)
ALT: 21 U/L (ref 0–44)
AST: 33 U/L (ref 15–41)
Albumin: 4.2 g/dL (ref 3.5–5.0)
Alkaline Phosphatase: 148 U/L — ABNORMAL HIGH (ref 38–126)
Anion gap: 11 (ref 5–15)
BUN: 18 mg/dL (ref 8–23)
CO2: 20 mmol/L — ABNORMAL LOW (ref 22–32)
Calcium: 9.2 mg/dL (ref 8.9–10.3)
Chloride: 108 mmol/L (ref 98–111)
Creatinine: 1.24 mg/dL — ABNORMAL HIGH (ref 0.44–1.00)
GFR, Estimated: 45 mL/min — ABNORMAL LOW
Glucose, Bld: 106 mg/dL — ABNORMAL HIGH (ref 70–99)
Potassium: 4.4 mmol/L (ref 3.5–5.1)
Sodium: 139 mmol/L (ref 135–145)
Total Bilirubin: 0.5 mg/dL (ref 0.0–1.2)
Total Protein: 7.4 g/dL (ref 6.5–8.1)

## 2024-01-12 LAB — FERRITIN: Ferritin: 68 ng/mL (ref 11–307)

## 2024-01-12 LAB — CBC WITH DIFFERENTIAL (CANCER CENTER ONLY)
Abs Immature Granulocytes: 0.04 K/uL (ref 0.00–0.07)
Basophils Absolute: 0.1 K/uL (ref 0.0–0.1)
Basophils Relative: 1 %
Eosinophils Absolute: 0.2 K/uL (ref 0.0–0.5)
Eosinophils Relative: 2 %
HCT: 33.6 % — ABNORMAL LOW (ref 36.0–46.0)
Hemoglobin: 11 g/dL — ABNORMAL LOW (ref 12.0–15.0)
Immature Granulocytes: 0 %
Lymphocytes Relative: 12 %
Lymphs Abs: 1.4 K/uL (ref 0.7–4.0)
MCH: 30.5 pg (ref 26.0–34.0)
MCHC: 32.7 g/dL (ref 30.0–36.0)
MCV: 93.1 fL (ref 80.0–100.0)
Monocytes Absolute: 1.3 K/uL — ABNORMAL HIGH (ref 0.1–1.0)
Monocytes Relative: 11 %
Neutro Abs: 8.9 K/uL — ABNORMAL HIGH (ref 1.7–7.7)
Neutrophils Relative %: 74 %
Platelet Count: 307 K/uL (ref 150–400)
RBC: 3.61 MIL/uL — ABNORMAL LOW (ref 3.87–5.11)
RDW: 13.2 % (ref 11.5–15.5)
WBC Count: 11.9 K/uL — ABNORMAL HIGH (ref 4.0–10.5)
nRBC: 0 % (ref 0.0–0.2)

## 2024-01-12 LAB — IRON AND IRON BINDING CAPACITY (CC-WL,HP ONLY)
Iron: 50 ug/dL (ref 28–170)
Saturation Ratios: 15 % (ref 10.4–31.8)
TIBC: 323 ug/dL (ref 250–450)
UIBC: 274 ug/dL

## 2024-01-12 LAB — VITAMIN B12: Vitamin B-12: 1176 pg/mL — ABNORMAL HIGH (ref 180–914)

## 2024-01-12 LAB — FOLATE: Folate: 15.3 ng/mL

## 2024-01-12 NOTE — Patient Instructions (Signed)
 Dear [name],  Sorry you have bladder cancer. The type of bladder cancer you have is a rare form. Some studies show it is more aggressive and likely to spread. Below are the steps we should take:  Obtain a PET scan to assess if cancer has spread, and the extend. If cancer has not spread or only involve a few lymph nodes, best option is if Dr. Alvaro can perform surgery to remove all of visible disease. After surgery, we go over findings of pathology specimen to see if additional treatment may be helpful, like radiation, chemotherapy or surgery. If cancer has spread to nearby lymph nodes, or you do not want to have bladder removed, we can plan on radiation with chemotherapy. When undergoing this route, continue cystoscopy surveillance after treatment is recommended. If cancer has spread to other site of the body, or not near the bladder, targeted chemotherapy called enfortumab vedotin with immunotherapy pembrolizumab will be recommended.   Once PET scan is completed we will follow up again next week.  Wish you have Gertha Christmas!

## 2024-01-12 NOTE — Telephone Encounter (Signed)
 Spoke with the patient she could not get her PET scan scheduled this Friday. So we will change the Tuesday appointment for the Tuesday after the test. Arland Legions BSN RN

## 2024-01-12 NOTE — Assessment & Plan Note (Addendum)
 B12, folate, ferritin and iron panel

## 2024-01-12 NOTE — Assessment & Plan Note (Addendum)
 PET scan asap CBC, CMP today Return next Tue hoping PET will be done by then

## 2024-01-18 NOTE — Addendum Note (Signed)
 Encounter addended by: Patrcia Cough, MD on: 01/18/2024 1:38 PM  Actions taken: Problem List reviewed, Medication List reviewed, Allergies reviewed

## 2024-01-20 ENCOUNTER — Inpatient Hospital Stay

## 2024-01-23 ENCOUNTER — Ambulatory Visit (HOSPITAL_COMMUNITY)
Admission: RE | Admit: 2024-01-23 | Discharge: 2024-01-23 | Disposition: A | Discharge status: Home / Self Care | Source: Ambulatory Visit

## 2024-01-23 DIAGNOSIS — C678 Malignant neoplasm of overlapping sites of bladder: Secondary | ICD-10-CM | POA: Diagnosis present

## 2024-01-23 LAB — GLUCOSE, CAPILLARY: Glucose-Capillary: 98 mg/dL (ref 70–99)

## 2024-01-23 MED ORDER — FLUDEOXYGLUCOSE F - 18 (FDG) INJECTION
6.8000 | Freq: Once | INTRAVENOUS | Status: AC
  Administered 2024-01-23: 6.559 via INTRAVENOUS

## 2024-01-26 ENCOUNTER — Inpatient Hospital Stay

## 2024-01-26 ENCOUNTER — Ambulatory Visit: Payer: Self-pay

## 2024-01-26 VITALS — BP 128/65 | HR 73 | Temp 97.7°F | Resp 17 | Wt 130.6 lb

## 2024-01-26 DIAGNOSIS — I129 Hypertensive chronic kidney disease with stage 1 through stage 4 chronic kidney disease, or unspecified chronic kidney disease: Secondary | ICD-10-CM | POA: Insufficient documentation

## 2024-01-26 DIAGNOSIS — E785 Hyperlipidemia, unspecified: Secondary | ICD-10-CM | POA: Insufficient documentation

## 2024-01-26 DIAGNOSIS — N1832 Chronic kidney disease, stage 3b: Secondary | ICD-10-CM | POA: Insufficient documentation

## 2024-01-26 DIAGNOSIS — C678 Malignant neoplasm of overlapping sites of bladder: Secondary | ICD-10-CM | POA: Diagnosis not present

## 2024-01-26 DIAGNOSIS — R948 Abnormal results of function studies of other organs and systems: Secondary | ICD-10-CM | POA: Diagnosis not present

## 2024-01-26 DIAGNOSIS — R933 Abnormal findings on diagnostic imaging of other parts of digestive tract: Secondary | ICD-10-CM | POA: Insufficient documentation

## 2024-01-26 DIAGNOSIS — I2510 Atherosclerotic heart disease of native coronary artery without angina pectoris: Secondary | ICD-10-CM | POA: Diagnosis not present

## 2024-01-26 DIAGNOSIS — K5900 Constipation, unspecified: Secondary | ICD-10-CM | POA: Insufficient documentation

## 2024-01-26 NOTE — Progress Notes (Signed)
 [facility] OFFICE PROGRESS NOTE  Patient Care Team: Ransom Other, MD as PCP - General (Internal Medicine)  [name] is a 74 y.o.female with history of CAD, HTN, HLD, CKD3b being [facility] for bladder cancer.   Last CrCl 41.    CT showed enhancing mass in the anterior bladder dome 4.7 x 4.2 x 2.3 centimeter, inferior left aspect of bladder 3 x 1.9 x 1.5 cm.  Prominent left external iliac 1 x 0.7 cm. TURBT reports a very large tumor area at least 10 cm total surface area. Pathology showed T2 high-grade UC with adenocarcinoma signet ring cell (70%).  She is here to discuss results of PET/CT.  Fortunately has no distant metastases.  Pelvic lymph node was not avid.  We went over the results.  Fortunately, there is no distant metastases on PET/CT.  This appeared to be localized disease at this time point.  Patient has declined surgical resection with cystectomy.  We discussed the standard of care would be neoadjuvant therapy, in this case EV/P given not a cisplatin candidate followed by surgery.  Alternatively, chemoradiation with low-dose gemcitabine.  Her creat clearance is low.  Either way, will need ongoing surveillance.  If considering CRT for bladder preservation, she will need ongoing cystoscopy surveillance along with systemic imaging.  Again long discussion and after discussion, she cannot make up her mind.  She asks about second opinion at [facility].  There is family member disagreed with using chemotherapy and radiation.  A family member recommends she sees Dr. Raynaldo Fairly.  I will place a referral as soon as possible.  I recommend not to have too long of delay given she still has localized disease on PET/CT.   Of note, PET scan showed avidity around the rectum.  Reports occasional constipation.  She was due for colonoscopy but had to go see a cardiologist and thereafter has not follow-up.  I placed a urgent referral for at least a sigmoidoscopy for evaluation.   She will also call her gastroenterology team. Assessment & Plan Cancer of overlapping sites of bladder [facility]) As of now she would like a second opinion.  Placed a referral asap to Dr. Fairly and Dr. Hedda. Abnormal positron emission tomography (PET) scan Previously supposed to get colonoscopy Placed a urgent referral for evaluation.  At least obtain sigmoidoscopy.  I personally spent a total of 44 minutes in the care of the patient today including preparing to see the patient, getting/reviewing separately obtained history, performing a medically appropriate exam/evaluation, counseling and educating, referring and communicating with other health care professionals, documenting clinical information in the EHR, independently interpreting results, communicating results, and coordinating care.  Orders Placed This Encounter  Procedures   Ambulatory referral to Gastroenterology    Referral Priority:   Urgent    Referral Type:   Consultation    Referral Reason:   Specialty Services Required    Number of Visits Requested:   1     Alice JAYSON Chihuahua, MD  INTERVAL HISTORY: Patient returns for follow-up. She gets constipation depending what she eat. No bloody stool or melena. No pain on bowel movement. Report a spot with pain when sitting down but not related to bowel movement. Report she last needed to see a cardiologist to get colonoscopy. She has not have follow up.  Oncology History  Cancer of overlapping sites of bladder (HCC)  11/11/2023 Imaging   CT A/P Prominent left external iliac lymph node measuring 1.0 x 0.7 cm (series  9, image 74) Enhancing mass of the anterior bladder dome measuring 4.7 x 4.2 x 2.3 cm (series 9, image 77, series 14, image 90). Additional mass of the inferior left aspect of the bladder measuring 3.0 x 1.9 x 1.5 cm (series 9, image 82, series 14, image 92). Adrenals/Urinary Tract: Adrenal glands are unremarkable. Kidneys are normal, without renal calculi, solid lesion, or  hydronephrosis.   12/04/2023 Pathology Results   A. LARGE NODULAR BLADDER TUMOR, TURBT:  Infiltrating high grade urothelial carcinoma with adenocarcinoma signet ring cell (70%) component  The carcinoma invades muscularis propria (detrusor muscle)  Urothelial carcinoma in situ is present   B. BLADDER TUMOR, BASE 1, TURBT:  Infiltrating high grade urothelial carcinoma involving muscularis propria   C. BLADDER TUMOR, BASE 2, TURBT: Benign submucosa with lymphocytic infiltration    01/08/2024 Initial Diagnosis   Cancer of overlapping sites of bladder [facility])  She developed gross hematuria in October 2025 and her PCP ordered a CT A/P for further evaluation which was performed on 11/11/2023 showing 2 large bladder tumors (4.7 cm anterior bladder dome and a 3 cm inferior left lateral wall) with prominent left external iliac lymph node.   11/25/23 consultation with Dr. Alvaro TURBT on 12/04/2023 where he removed a large volume of tumor, approximately 10 cm, but clearly residual tumor remained.    Final surgical pathology confirmed high-grade infiltrating urothelial carcinoma with adenocarcinoma signet ring cell component of 70%, with involvement of the detrusor muscle.   She is adamantly not interested in cystectomy. So was referred to Rad onc for evaluation.   01/12/2024 Cancer Staging   Staging form: Urinary Bladder, AJCC 8th Edition - Clinical: Stage IIIA (cT2, cN1, cM0) - Signed by Tina Alice BROCKS, MD on 01/12/2024 WHO/ISUP grade (low/high): High Grade Histologic grading system: 2 grade system      PHYSICAL EXAMINATION: ECOG PERFORMANCE STATUS: 1  Vitals:   01/26/24 1253  BP: 128/65  Pulse: 73  Resp: 17  Temp: 97.7 F (36.5 C)  SpO2: 97%   Filed Weights   01/26/24 1253  Weight: 130 lb 9.6 oz (59.2 kg)    GENERAL: alert, no distress and comfortable SKIN: skin color normal and no jaundice. Small scaly lesion over the left butt cheek  Relevant data reviewed during this  visit included labs and imaging.

## 2024-01-26 NOTE — Assessment & Plan Note (Addendum)
 As of now she would like a second opinion.  Placed a referral asap to Dr. Maree and Dr. Hedda.

## 2024-01-27 ENCOUNTER — Inpatient Hospital Stay

## 2024-01-27 ENCOUNTER — Other Ambulatory Visit: Payer: Self-pay

## 2024-02-03 NOTE — Progress Notes (Signed)
 "  GENITOURINARY ONCOLOGY CLINIC - NEW CONSULTATION   Dear Montel Hasten: We are not able to discuss the details of this and other physician records via telephone or MyChart messages.  We appreciate your interest in compiling your medical records using source medical charting such as this document. Use of medical terminology and expressions during your visit were used when necessary and generally kept to a minimum for your best understanding of the diagnosis, workup, and next steps.  If you have a question regarding a specific medical term, expression, or phrase that you think was not integrated into the discussion, please write it down so it may be addressed at your next clinic visit.  You may call [phone] to schedule a clinic visit to specifically discuss what is written in this physician note.   PHYSICIAN REQUESTING CONSULTATION: Tina Hudson, MD  PATIENT IDENTIFICATION  [name] is a 74 y.o. female from [place] [address] presented with gross hematuria and workup found urothelial cancer and is now kindly referred by Dr. JONELLE Tina for multidisciplinary care.  CHIEF COMPLAINT  locally advanced Urothelial cancer of the Bladder; here for second opinion   ASSESSMENT / PLAN  74 y.o. with muscle invasive urothelial cancer, MIBC cT3, pending management decision for definitive care.  She was initially diagnosed with high grade MIBC with prominent glandular features   No cancer related symptoms.   #   PLAN:  -  We discussed the natural history of the disease, stage using specific terms muscle invasive disease, stg III, and additional workup needed, as well as next line treatment options including available clinical trials.   - we discussed options for curative intent therapy and specifically reviewed bladder-directed therapy with either surgery or radiation with trimodal therapy.  We additionally discussed the rationale and recommendation for perioperative therapy.  I reviewed my  agreement with her oncologist, Dr. Tina, regarding her icsplatin-ineligible status and consideration for use of EV and pembro per recent Keynote 905 data.  We discussed TMT/chemo-RT in this setting to be an option however given the variant histology I let her know my recommendation for perioperative therapy and surgery as the approach to give her the best chance for durable remission and disease control.  We, however, did discuss the significant side effects related to therapy which can impact her daily activities, QOL, and certainly her urinary habits.   - we reviewed merits and risks of EV and pembro in fair detail, and reviewed the many possible inflammatory side effects.   - she will see uro-oncology and radiation oncology for a multi-D discussion so she may make a decision on what's best for her.   - I provided my contact info should she wish for a future consultation.  I expressed that should she wish for a definitive approach that treatment should begin asap, ideally within ~3-6 wks, keeping in mind her TURBT was in Nov 2025.  -f/u as follows:   1-   2-  Supportive care - discussed goals for nutrition and exercise to mitigate cancer-related and treatment related effects.  - no cancer related pain, no indication for prescription analgesia  Patient Verbally Agreed to Allow this Ambient Listening Synopsis:  Assessment & Plan Stage III urothelial carcinoma of the bladder with glandular features Aggressive stage III urothelial carcinoma with glandular features, localized to pelvis, high risk of microscopic spread. Surgery declined, immunotherapy preferred. Radiation and chemotherapy less effective for tumor size and histology. Systemic therapy alone may not ensure durable local control. Ivermectin contraindicated  due to hepatotoxicity risk. - Initiated combination immunotherapy with enfortumab vedotin and pembrolizumab, weekly dosing, two weeks on, one week off, for 3-4 cycles. - Discussed  induction immunotherapy followed by surgical consolidation; patient reluctant about surgery. - Reviewed radiation plus concurrent chemotherapy as alternative, noting reduced efficacy and potential to preclude future surgery. - Discussed systemic therapy alone, acknowledging potential need for future urologic interventions. - Advised against ivermectin and unproven therapies due to hepatotoxicity risk. - Educated on immunotherapy risks and benefits, including immune-related adverse events, and provided reading material. - Arranged nurse contact for therapy initiation instructions. - Encouraged surgical consultation with urology for consolidation options post-immunotherapy. - Discussed management of local recurrences with urologic interventions for symptom control.  Chronic kidney disease Chronic kidney disease limits use of cytotoxic chemotherapy due to toxicity and impaired clearance. Immunotherapy preferred for safety in renal impairment. - Recommended avoiding standard chemotherapy due to toxicity risk. - Selected immunotherapy (EV and pembrolizumab) for improved safety in renal impairment.      ONCOLOGIC HISTORY  Summary of Key Events:  2025- Nov 13       #Diagnosis: TURBT [facility]) identified muscle invasive urothelial carcinoma of the bladder, MIBC with prominent glandular diff (up to ~70%). Also some foci of CIS and intenstinal metaplasia.   ____&&&_____&&&________&&_____ 11/11/23: CT with anterior bladder dome mass and inferior left aspect of bladder. Slightly enlarged L external iliac node (1 x 0.7cm).  12/04/23: OR for TURBT and b/l retrograde pyelogram (unremarkable). Nodular bladder tumor at least 10cm encompassing dome and left lateral wall areas, sparing ureteral orifices. Large amount of residual tumor was left, deemed not endoscopically curable. Path with HG T2 UC with adenocarcinoma signet ring cell (70%) component.  01/12/24: Seen by med onc at [facility], not an  ideal cisplatin candidate 01/23/24: PET CT - No distant mets, pelvic lymph nodes not avid. Some avidity around the rectum. Urgent referral placed for sigmoidoscopy.   MOLECULAR DIAGNOSTICS AND MARKERS  GENOMICS, pending future collection  INTERVAL HISTORY    Patient Verbally Agreed to Allow this Ambient Listening Synopsis:  History of Present Illness [name] is a 74 year old female with newly diagnosed stage III urothelial carcinoma of the bladder who presents for oncology consultation regarding initial management and treatment options.  Hematuria began in September 2025, initially as mild pink urine, and progressively worsened over several months. She subsequently developed persistent pelvic pain and a sensation of a mass intravaginally. Gynecologic evaluation was unremarkable. Hematuria intensified, prompting emergency evaluation and subsequent transfer for further workup.  She has not received prior chemotherapy, immunotherapy, surgery, or radiation for bladder cancer. She has chronic kidney disease. She is not on anticoagulation and has no history of thromboembolic events. She has a remote history of skin cancer at the nasal bridge, treated approximately two years ago, but is unsure of the specific type. She denies any history of immunologic or autoimmune disease.  She expressed significant concern regarding the impact of potential cystectomy and pelvic organ removal on her quality of life and is currently less inclined to pursue surgical intervention. She expressed interest in non-surgical options, including immunotherapy. Her son inquired about blood thinners, alternative therapies, and concerns regarding radiation and chemotherapy.   The NCCN distress tool intake data for review of systems and significant symptoms has been reviewed with the patient and discussed; this constitutes a 12 point review of systems.  Otherwise a review of systems was negative and complete; of note, some  specific ROS include:  Denies headache Denies  hematuria Denies dyspnea at rest, new cough, pleuritic chest pain  Denies back and bone pain Denies history of rheumatic, connective tissue, or autoimmune disease, including IBD.    PMH Bladder Cancer diagnosed in Nov 2025   Meds include but not limited to:  Soc Hx:  - Substance Use:   - Tobacco: Former smoker  - Employment: Film/video editor at locations in [place]  - Exposures:   - Well water (Childhood)   - Farming (tobacco, corn, vegetables) (Childhood) - Partner Status: Married - Living Situation: Lives with husband; younger son lives across the road, older son lives in [place] Has two sons and grandchildren; family support present; no information provided on education, [employer], legal issues, financial situation, or hobbies.   Fam Hx:  - Brother: prostate cancer - No family history of cancer in patient's children or grandchildren.   PAST MEDICAL/SURGICAL HISTORY   Past Medical History:  Diagnosis Date   CKD (chronic kidney disease) stage 3, GFR 30-59 ml/min (CMS-HCC)    Coronary artery disease    Hyperlipidemia    Hypertension    Past Surgical History:  Procedure Laterality Date   CESAREAN DELIVERY     1983, 1985    No family history on file. Social History   Socioeconomic History   Marital status: Married  Tobacco Use   Smoking status: Never   Smokeless tobacco: Never  Vaping Use   Vaping status: Never Used   Social Drivers of Health   Food Insecurity: No Food Insecurity (01/08/2024)   Received from [facility]   Hunger Vital Sign    Within the past 12 months, you worried that your food would run out before you got the money to buy more.: Never true    Within the past 12 months, the food you bought just didn't last and you didn't have money to get more.: Never true  Transportation Needs: No Transportation Needs (01/08/2024)   Received from [facility] - Transportation    In  the past 12 months, has lack of transportation kept you from medical appointments or from getting medications?: No    In the past 12 months, has lack of transportation kept you from meetings, work, or from getting things needed for daily living?: No  Social Connections: Moderately Isolated (11/14/2023)   Received from [facility]   Social Connection and Isolation Panel    In a typical week, how many times do you talk on the phone with family, friends, or neighbors?: Three times a week    How often do you get together with friends or relatives?: Three times a week    How often do you attend church or religious services?: Never    Do you belong to any clubs or organizations such as church groups, unions, fraternal or athletic groups, or school groups?: No    How often do you attend meetings of the clubs or organizations you belong to?: Never    Are you married, widowed, divorced, separated, never married, or living with a partner?: Married    ALLERGIES   Allergies  Allergen Reactions   Penicillins Rash   Sulfa (Sulfonamide Antibiotics) Other (See Comments), Nausea And Vomiting and Rash   CURRENT MEDICATIONS   Outpatient Encounter Medications as of 02/04/2024  Medication Sig Dispense Refill   atorvastatin  (LIPITOR) 40 MG tablet Take 40 mg by mouth once daily     ezetimibe  (ZETIA ) 10 mg tablet Take 10 mg by mouth once daily  metoprolol  SUCCinate (TOPROL -XL) 25 MG XL tablet Take 25 mg by mouth once daily     sennosides-docusate (SENOKOT-S) 8.6-50 mg tablet Take 1 tablet by mouth     amLODIPine (NORVASC) 5 MG tablet Take 5 mg by mouth every morning     aspirin  81 mg tablet 160 mg by oral route.     buPROPion  (WELLBUTRIN  XL) 300 MG XL tablet TAKE ONE TABLET BY MOUTH EVERY MORNING AFTER BREAKFAST     cholecalciferol (VITAMIN D3) 1000 unit capsule Take 1,000 Units by mouth once daily     FLUoxetine  (PROZAC ) 20 MG capsule      hydrOXYzine HCL (ATARAX) 10 MG tablet       multivitamin tablet Take by mouth     omeprazole (PRILOSEC) 20 MG DR capsule Take 20 mg by mouth every morning before breakfast     rizatriptan  (MAXALT ) 10 MG tablet 10 mg by oral route.     topiramate  (TOPAMAX ) 100 MG tablet TAKE ONE TABLET BY MOUTH EVERYDAY AT BEDTIME     No facility-administered encounter medications on file as of 02/04/2024.     REVIEW OF SYSTEMS   NCCN Distress Questionnaire  Questionnaire Response Comments  Patient declines NCCN Distress Questionnaire:        Distress Thermometer Score (0-10) 0 - No distress   Comments:     Concern Response Comments  Physical  Concerns Pain, Sleep    Emotional Concerns Worry or anxiety    Social Concerns      Practical Concerns      Spiritual/Religious Concerns      Other Concerns     Resources Provided      Referrals Provided         PHYSICAL EXAMINATION   Vitals:   02/04/24 0827  BP: 125/77  Pulse: 66  Resp: 16  Temp: 36.4 C (97.5 F)  TempSrc: Oral  SpO2: 97%  Weight: 59.1 kg (130 lb 4.7 oz)  Height: 160 cm (5' 2.99)  PainSc:   2  PainLoc: Other (Comment)  Body mass index is 23.09 kg/m.Body surface area is 1.62 meters squared.  ECOG performance status: 1 - Symptomatic but completely ambulatory KPS: 80  Normal activity with effort  Constitutional: Well-appearing,NAD, with her husband Eyes: Clear conjunctivae, no scleral icterus Ears, Nose, Mouth & Throat: Clear oropharynx, no erythema/petecchiae Cardiovascular: Normal S1/S2, regular, no murmurs/rubs/gallops Respiratory: Clear to auscultation bilaterally, no wheezes/rales/rhonchi Gastrointestinal: Soft abdomen, nontender/nondistended. Normoactive bowel sounds. Genitourinary: not examined Musculoskeletal: No midline spinal tenderness, no CVA tenderness Ext:  No edema Skin: No rashes, erythema, petecchiae. Neurologic: Alert and oriented x3. Gait intact. Otherwise nonfocal. Lymphatic: No cervical/supraclavicular/axillary/inguinal lymphadenopathy.    LABS/STUDIES    Results for orders placed or performed during the hospital encounter of 01/29/24  Pathology - Slide Consult  Result Value Ref Range   Case Report      Surgical Pathology Report                         Case: [id number]                                 Authorizing Provider:  Maree Raynaldo Erm, MD    Collected:           01/29/2024                  Ordering Location:     [facility]  Received:            01/30/2024 1200             Pathologist:           Delora Capron, MD                                                            Specimen:    Bladder, [id number]                                                                     DIAGNOSIS      Outside case, [phone], [facility], [place], [place].  Date of procedure 12/04/23:   A. Large nodular bladder tumor, transurethral resection:  Invasive high-grade urothelial carcinoma with prominent glandular differentiation. Muscularis propria is present and involved by invasive carcinoma. Bladder also with foci of urothelial carcinoma-in-situ, intestinal metaplasia, and keratinizing squamous metaplasia. See comment.  Comment: The invasive carcinoma demonstrates glandular features in 70% of the examined tumor; the glandular features vary, ranging from areas of more well-differentiated infiltrative glands to foci with signet ring cell morphology.  Occasional foci with extracellular mucin are also noted.  The presence of urothelial carcinoma-in-situ favors a high-grade urothelial carcinoma with prominent glandular differentiation, although involvement by an extravesical primary is difficult to entirely exclude by morphology alone.  Clinical correlation is recommended.  Key portions of this case were also reviewed by Dr. Tresea Mains, who concurs.  B. Bladder tumor, Base 1, transurethral resection:  Invasive high-grade urothelial carcinoma with glandular differentiation. Muscularis propria is present and  involved by invasive carcinoma.  C. Bladder tumor, base 2, transurethral resection:  Submucosal tissue with chronic inflammation. Negative for malignancy.    Clinical Information      [facility] review of outside slides is requested.     Gross Examination      Outside case A:              [id number] Date of surgery:              12/04/23 Number of stained slides:             6 Number of blocks:                        0 Number of unstained slides:         0  Outside path report received?    Yes Material to be returned?             Yes  Received from:   [facility] [address] [place], [place] [address] P:  [phone] F:  [phone]    Microscopic Examination      Microscopic examination is performed.     Additional Documentation      All immunohistochemistry, in situ hybridization tests and special stains performed at [facility] and reported herein were developed, validated and their performance characteristics determined by the [facility]. During the performance of these  tests, appropriate positive and negative control slides are also performed and reviewed. All control slides and internal controls (when applicable) demonstrate the expected immunoreactive patterns and/or nucleic acid hybridization. These ancillary studies were deemed medically necessary by the requesting pathologist. They were ordered following review of the H&E and clinical history except when part of a liver/kidney protocol or where clinical history (e.g. immunocompromised, critically ill, history of malignancy) clearly indicates. Some of the tests may not be cleared or approved by the U.S. Food and Drug Administration (FDA).  The FDA has determined that such clearance or approval is not necessary.  These tests are used for clinical purposes and should not be regarded as investigational or as research.  This laboratory is  certified under the Clinical Laboratory Improvement Amendments of 1988 (CLIA) as qualified to perform high complexity clinical testing.    Attestation      All of the diagnostic evaluations on the enumerated specimens have been personally conducted by the pathologists involved in the care of this patient as indicated by the electronic signatures above.    All studies were reviewed personally by me in the clinic.   The plan was discussed in detail with the patient today, who expressed understanding.  The patient has my contact information, and understands to call me with any additional questions or concerns.  I personally spent time to complete our oncology visit today.  This included preparing to see the patient on the date of the service through chart review of interval patient/caregiver-generated messaging, and obtaining and/or reviewing separately obtained history via electronic chart.  It also included face-to-face patient care, completing clinical documentation, obtaining and/or reviewing additional charting, performing a medically appropriate examination, counseling and educating the patient/family/caregiver, ordering medications, tests, or procedures, communicating with other HCPs, independently interpreting results, communicating results to the patient/family/caregiver and care coordination. Time totaled 80 minutes for this encounter.     Thank you for the opportunity to participate in the care of Ms. Zulema.  Please do not hesitate to contact me with questions regarding this visit.  Patient Verbally Agreed to Allow this Ambient Listening Technology:  This note has been created using automated tools and reviewed for accuracy by CHRISTOPHER HOIMES.  -- [facility]  --  Dr Lonni PARAS Hoimes Director of [facility] for [facility] Immunotherapy [facility]) Experimental Immuno[facility] Program [facility] Oncology  [place], [place] Fax: [phone] "

## 2024-02-05 ENCOUNTER — Telehealth: Payer: Self-pay | Admitting: *Deleted

## 2024-02-05 NOTE — Telephone Encounter (Signed)
 Jonnette left a message stating she had not heard from [facility] regarding scheduling a colonoscopy.   RN left a message for [facility] to call patient. [facility] told her that she needs the colonoscopy before they can treat her. RN gave her the phone number to call to schedule if she has not heard from them by lunch tomorrow

## 2024-02-27 ENCOUNTER — Other Ambulatory Visit: Payer: Self-pay

## 2024-02-27 DIAGNOSIS — N3289 Other specified disorders of bladder: Secondary | ICD-10-CM

## 2024-02-27 MED ORDER — HYDROCODONE-ACETAMINOPHEN 5-325 MG PO TABS: 1.0000 | ORAL_TABLET | Freq: Four times a day (QID) | ORAL | 0 refills | Status: AC | PRN

## 2024-02-27 NOTE — Progress Notes (Signed)
 Called and spoke to patient if she has made up her decision. Said she is waiting to see GI. She said she could not get in earlier. Discussed she should make her decision soon for her bladder cancer. Report some pain in the bladder area. She has been taking ibuprofen but needs something else. I sent Norco to her [facility] as she requested. Advised on precautions.  Discussed recommendation from [facility]. EV/P and then cystectomy. She wants to get EV/P but not surgery. Let her know this is not a curable option or recommendation.  She will follow up with GI on Wednesday and see me next Friday  Please put her down for Fri 2/13 at 10:30. Lab before visit.

## 2024-03-03 ENCOUNTER — Ambulatory Visit: Admitting: Physician Assistant

## 2024-04-01 ENCOUNTER — Other Ambulatory Visit (HOSPITAL_COMMUNITY)
